# Patient Record
Sex: Male | Born: 1970 | Race: White | Hispanic: No | Marital: Single | Smoking: Never smoker
Health system: Southern US, Community
[De-identification: ages and names within clinical notes are randomized; demographics above are authoritative.]

## PROBLEM LIST (undated history)

## (undated) DIAGNOSIS — I4891 Unspecified atrial fibrillation: Secondary | ICD-10-CM

## (undated) DIAGNOSIS — I10 Essential (primary) hypertension: Secondary | ICD-10-CM

## (undated) HISTORY — DX: Essential (primary) hypertension: I10

## (undated) HISTORY — PX: NO PAST SURGERIES: SHX2092

---

## 2005-01-18 ENCOUNTER — Ambulatory Visit: Payer: Self-pay | Admitting: General Practice

## 2014-09-15 DIAGNOSIS — I4891 Unspecified atrial fibrillation: Secondary | ICD-10-CM

## 2014-09-15 HISTORY — DX: Unspecified atrial fibrillation: I48.91

## 2015-08-16 ENCOUNTER — Other Ambulatory Visit
Admission: RE | Admit: 2015-08-16 | Discharge: 2015-08-16 | Disposition: A | Discharge status: Home / Self Care | Payer: Worker's Compensation | Attending: Family Medicine | Admitting: Family Medicine

## 2016-01-11 ENCOUNTER — Ambulatory Visit: Payer: Self-pay | Admitting: Physician Assistant

## 2016-01-11 ENCOUNTER — Encounter: Payer: Self-pay | Admitting: Physician Assistant

## 2016-01-11 VITALS — BP 135/90 | HR 61 | Temp 97.8°F

## 2016-01-11 DIAGNOSIS — I10 Essential (primary) hypertension: Secondary | ICD-10-CM

## 2016-01-11 DIAGNOSIS — H9313 Tinnitus, bilateral: Secondary | ICD-10-CM

## 2016-01-11 MED ORDER — LISINOPRIL 20 MG PO TABS: 20.0000 mg | ORAL_TABLET | Freq: Every day | ORAL | Status: DC

## 2016-01-11 NOTE — Addendum Note (Signed)
Addended by: Rudene Anda T on: 01/11/2016 09:05 AM   Modules accepted: Orders

## 2016-01-11 NOTE — Progress Notes (Signed)
   Subjective: Ringing in Ears    Patient ID: Bradley Sexton, male    DOB: December 07, 1970, 45 y.o.   MRN: [id number]  HPI  Patient c/o 3-4 years of Ringing in both ears.Strong family history of this compliant. Patient has not been evaluated by ENT. Also request refill of Lisinopril.   Review of Systems Obesity. And HTN.    Objective:   Physical Exam HEENT unremarkable. Neck supple. Lungs CTA, and Heart RRR.       Assessment & Plan:Tinnitus  Consult to ENT and refill Lisinopril 20 mg.

## 2016-01-11 NOTE — Addendum Note (Signed)
Addended by: Sable Feil on: 01/11/2016 10:28 AM   Modules accepted: Orders

## 2016-01-12 LAB — CMP12+LP+TP+TSH+6AC+PSA+CBC…
A/G RATIO: 1.4 (ref 1.2–2.2)
ALBUMIN: 3.8 g/dL (ref 3.5–5.5)
ALT: 27 IU/L (ref 0–44)
AST: 22 IU/L (ref 0–40)
Alkaline Phosphatase: 76 IU/L (ref 39–117)
BILIRUBIN TOTAL: 0.7 mg/dL (ref 0.0–1.2)
BUN/Creatinine Ratio: 16 (ref 9–20)
BUN: 14 mg/dL (ref 6–24)
Basophils Absolute: 0 10*3/uL (ref 0.0–0.2)
Basos: 0 %
CHOLESTEROL TOTAL: 193 mg/dL (ref 100–199)
CREATININE: 0.88 mg/dL (ref 0.76–1.27)
Calcium: 9.5 mg/dL (ref 8.7–10.2)
Chloride: 101 mmol/L (ref 96–106)
Chol/HDL Ratio: 5.8 ratio units — ABNORMAL HIGH (ref 0.0–5.0)
EOS (ABSOLUTE): 0.1 10*3/uL (ref 0.0–0.4)
ESTIMATED CHD RISK: 1.2 times avg. — AB (ref 0.0–1.0)
Eos: 1 %
Free Thyroxine Index: 2.8 (ref 1.2–4.9)
GFR, EST AFRICAN AMERICAN: 122 mL/min/{1.73_m2} (ref >59)
GFR, EST NON AFRICAN AMERICAN: 105 mL/min/{1.73_m2} (ref >59)
GGT: 29 IU/L (ref 0–65)
GLOBULIN, TOTAL: 2.8 g/dL (ref 1.5–4.5)
Glucose: 87 mg/dL (ref 65–99)
HDL: 33 mg/dL — AB (ref >39)
Hematocrit: 43.4 % (ref 37.5–51.0)
Hemoglobin: 14.4 g/dL (ref 12.6–17.7)
IRON: 121 ug/dL (ref 38–169)
Immature Grans (Abs): 0 10*3/uL (ref 0.0–0.1)
Immature Granulocytes: 0 %
LDH: 160 IU/L (ref 121–224)
LDL Calculated: 140 mg/dL — ABNORMAL HIGH (ref 0–99)
Lymphocytes Absolute: 2.3 10*3/uL (ref 0.7–3.1)
Lymphs: 38 %
MCH: 30.7 pg (ref 26.6–33.0)
MCHC: 33.2 g/dL (ref 31.5–35.7)
MCV: 93 fL (ref 79–97)
MONOS ABS: 0.4 10*3/uL (ref 0.1–0.9)
Monocytes: 6 %
NEUTROS ABS: 3.2 10*3/uL (ref 1.4–7.0)
Neutrophils: 55 %
POTASSIUM: 5.1 mmol/L (ref 3.5–5.2)
Phosphorus: 3.1 mg/dL (ref 2.5–4.5)
Platelets: 223 10*3/uL (ref 150–379)
Prostate Specific Ag, Serum: 0.3 ng/mL (ref 0.0–4.0)
RBC: 4.69 x10E6/uL (ref 4.14–5.80)
RDW: 13 % (ref 12.3–15.4)
Sodium: 141 mmol/L (ref 134–144)
T3 UPTAKE RATIO: 30 % (ref 24–39)
T4, Total: 9.3 ug/dL (ref 4.5–12.0)
TOTAL PROTEIN: 6.6 g/dL (ref 6.0–8.5)
TRIGLYCERIDES: 101 mg/dL (ref 0–149)
TSH: 3.21 u[IU]/mL (ref 0.450–4.500)
Uric Acid: 9.5 mg/dL — ABNORMAL HIGH (ref 3.7–8.6)
VLDL CHOLESTEROL CAL: 20 mg/dL (ref 5–40)
WBC: 6 10*3/uL (ref 3.4–10.8)

## 2016-01-12 LAB — VITAMIN D 25 HYDROXY (VIT D DEFICIENCY, FRACTURES): VIT D 25 HYDROXY: 29.9 ng/mL — AB (ref 30.0–100.0)

## 2016-03-10 ENCOUNTER — Ambulatory Visit: Payer: Self-pay | Admitting: Physician Assistant

## 2016-03-10 ENCOUNTER — Encounter: Payer: Self-pay | Admitting: Physician Assistant

## 2016-03-10 VITALS — BP 130/90 | HR 72 | Temp 98.3°F

## 2016-03-10 DIAGNOSIS — J069 Acute upper respiratory infection, unspecified: Secondary | ICD-10-CM

## 2016-03-10 DIAGNOSIS — W57XXXA Bitten or stung by nonvenomous insect and other nonvenomous arthropods, initial encounter: Secondary | ICD-10-CM

## 2016-03-10 MED ORDER — DOXYCYCLINE HYCLATE 100 MG PO TABS: 100.0000 mg | ORAL_TABLET | Freq: Two times a day (BID) | ORAL | Status: DC

## 2016-03-10 MED ORDER — FLUTICASONE PROPIONATE 50 MCG/ACT NA SUSP: 2.0000 | Freq: Every day | NASAL | Status: DC

## 2016-03-10 NOTE — Progress Notes (Signed)
S: C/o runny nose and congestion, sore throat, cough for 3 days, + fever, chills, no cp/sob, v/d; mucus was green/brown , cough is sporadic, also had tick bite about 3 weeks ago, no rash  Using otc meds: coricidan  O: PE: vitals wnl, perrl eomi, normocephalic, tms dull, nasal mucosa red and swollen, throat injected, neck supple no lymph, lungs c t a, cv rrr, neuro intact  A:  Acute  Uri, tick bite   P: flonase, doxy, recheck bp when at home, if still elevated f/u with pcp, drink fluids, continue regular meds , use otc meds of choice, return if not improving in 5 days, return earlier if worsening

## 2017-01-21 ENCOUNTER — Ambulatory Visit: Payer: Self-pay | Admitting: Physician Assistant

## 2017-01-21 ENCOUNTER — Encounter: Payer: Self-pay | Admitting: Physician Assistant

## 2017-01-21 VITALS — BP 120/80 | HR 73 | Temp 97.8°F | Resp 16 | Ht 72.0 in | Wt 332.0 lb

## 2017-01-21 DIAGNOSIS — Z0000 Encounter for general adult medical examination without abnormal findings: Secondary | ICD-10-CM

## 2017-01-21 DIAGNOSIS — Z008 Encounter for other general examination: Secondary | ICD-10-CM

## 2017-01-21 DIAGNOSIS — Z0189 Encounter for other specified special examinations: Secondary | ICD-10-CM

## 2017-01-21 MED ORDER — LISINOPRIL 20 MG PO TABS: 20.0000 mg | ORAL_TABLET | Freq: Every day | ORAL | 3 refills | Status: AC

## 2017-01-21 NOTE — Progress Notes (Signed)
S: pt here for wellness physical and biometrics for insurance purposes, no complaints ros neg. States he has been eating better and going to the gym, has lost between 40-50lbs;  PMH: htn x 2years   Social: nonsmoker, no etoh, no drugs, works at [employer]: mother died at 21 of MI or stroke, had htn, dm, high cholesterol; father is still living in his 55s and healthy, 1 brother is prediabetic, 1 sister is healthy  O: vitals wnl, nad, ENT wnl, neck supple no lymph, lungs c t a, cv rrr, abd soft nontender bs normal all 4 quads  A: wellness, biometric physical  P: fasting labs today, return in April for repeat physical/biometrics, concentrate on weight loss and healthy eating habits, refill of bp meds sent to pharmacy

## 2017-01-22 LAB — CMP12+LP+TP+TSH+6AC+PSA+CBC…
ALK PHOS: 84 IU/L (ref 39–117)
ALT: 25 IU/L (ref 0–44)
AST: 32 IU/L (ref 0–40)
Albumin/Globulin Ratio: 1.7 (ref 1.2–2.2)
Albumin: 4.3 g/dL (ref 3.5–5.5)
BASOS ABS: 0 10*3/uL (ref 0.0–0.2)
BUN/Creatinine Ratio: 14 (ref 9–20)
BUN: 13 mg/dL (ref 6–24)
Basos: 0 %
Bilirubin Total: 0.7 mg/dL (ref 0.0–1.2)
CALCIUM: 9.1 mg/dL (ref 8.7–10.2)
CHOLESTEROL TOTAL: 155 mg/dL (ref 100–199)
Chloride: 103 mmol/L (ref 96–106)
Chol/HDL Ratio: 4.8 ratio (ref 0.0–5.0)
Creatinine, Ser: 0.93 mg/dL (ref 0.76–1.27)
EOS (ABSOLUTE): 0.1 10*3/uL (ref 0.0–0.4)
Eos: 1 %
Estimated CHD Risk: 1 times avg. (ref 0.0–1.0)
FREE THYROXINE INDEX: 2.3 (ref 1.2–4.9)
GFR calc Af Amer: 115 mL/min/{1.73_m2} (ref >59)
GFR calc non Af Amer: 99 mL/min/{1.73_m2} (ref >59)
GGT: 22 IU/L (ref 0–65)
Globulin, Total: 2.6 g/dL (ref 1.5–4.5)
Glucose: 89 mg/dL (ref 65–99)
HDL: 32 mg/dL — ABNORMAL LOW (ref >39)
HEMOGLOBIN: 14.6 g/dL (ref 13.0–17.7)
Hematocrit: 43.7 % (ref 37.5–51.0)
IMMATURE GRANULOCYTES: 0 %
Immature Grans (Abs): 0 10*3/uL (ref 0.0–0.1)
Iron: 71 ug/dL (ref 38–169)
LDH: 196 IU/L (ref 121–224)
LDL CALC: 112 mg/dL — AB (ref 0–99)
LYMPHS ABS: 2.1 10*3/uL (ref 0.7–3.1)
Lymphs: 32 %
MCH: 30.8 pg (ref 26.6–33.0)
MCHC: 33.4 g/dL (ref 31.5–35.7)
MCV: 92 fL (ref 79–97)
MONOS ABS: 0.5 10*3/uL (ref 0.1–0.9)
Monocytes: 7 %
NEUTROS PCT: 60 %
Neutrophils Absolute: 3.8 10*3/uL (ref 1.4–7.0)
PLATELETS: 220 10*3/uL (ref 150–379)
PROSTATE SPECIFIC AG, SERUM: 0.7 ng/mL (ref 0.0–4.0)
Phosphorus: 2.9 mg/dL (ref 2.5–4.5)
Potassium: 5 mmol/L (ref 3.5–5.2)
RBC: 4.74 x10E6/uL (ref 4.14–5.80)
RDW: 13 % (ref 12.3–15.4)
Sodium: 142 mmol/L (ref 134–144)
T3 Uptake Ratio: 29 % (ref 24–39)
T4 TOTAL: 8 ug/dL (ref 4.5–12.0)
TRIGLYCERIDES: 54 mg/dL (ref 0–149)
TSH: 3.91 u[IU]/mL (ref 0.450–4.500)
Total Protein: 6.9 g/dL (ref 6.0–8.5)
Uric Acid: 9.3 mg/dL — ABNORMAL HIGH (ref 3.7–8.6)
VLDL Cholesterol Cal: 11 mg/dL (ref 5–40)
WBC: 6.6 10*3/uL (ref 3.4–10.8)

## 2017-01-22 LAB — VITAMIN D 25 HYDROXY (VIT D DEFICIENCY, FRACTURES): Vit D, 25-Hydroxy: 42.7 ng/mL (ref 30.0–100.0)

## 2017-01-22 LAB — HCV COMMENT:

## 2017-01-22 LAB — HIV ANTIBODY (ROUTINE TESTING W REFLEX): HIV SCREEN 4TH GENERATION: NONREACTIVE

## 2017-01-22 LAB — HGB A1C W/O EAG: Hgb A1c MFr Bld: 5.4 % (ref 4.8–5.6)

## 2017-01-22 LAB — HEPATITIS C ANTIBODY (REFLEX)

## 2018-01-22 ENCOUNTER — Ambulatory Visit: Payer: Self-pay | Admitting: Family Medicine

## 2018-01-22 VITALS — BP 161/110 | HR 62 | Temp 97.9°F | Resp 14 | Ht 72.0 in | Wt 323.0 lb

## 2018-01-22 DIAGNOSIS — Z0189 Encounter for other specified special examinations: Principal | ICD-10-CM

## 2018-01-22 DIAGNOSIS — Z008 Encounter for other general examination: Secondary | ICD-10-CM

## 2018-01-22 LAB — GLUCOSE, POCT (MANUAL RESULT ENTRY): POC GLUCOSE: 96 mg/dL (ref 70–99)

## 2018-01-22 NOTE — Progress Notes (Signed)
Subjective: Annual biometrics screening  Patient presents for his annual biometric screening. Patient reports a history of hypertension.  Patient denies getting regular exercise or eating a healthy, well-rounded diet.  Patient reports increased stress in the last 2 months that has resulted in this change. PCP: None currently. Patient denies any other issues or concerns.   Review of Systems Unremarkable  Objective  Physical Exam General: Awake, alert and oriented. No acute distress. Well developed, hydrated and nourished. Appears stated age.  HEENT: Supple neck without adenopathy. Sclera is non-icteric. The ear canal is clear without discharge. The tympanic membrane is normal in appearance with normal landmarks and cone of light. Nasal mucosa is pink and moist. Oral mucosa is pink and moist. The pharynx is normal in appearance without tonsillar swelling or exudates.  Skin: Skin in warm, dry and intact without rashes or lesions. Appropriate color for ethnicity. Cardiac: Heart rate and rhythm are normal. No murmurs, gallops, or rubs are auscultated.  Respiratory: The chest wall is symmetric and without deformity. No signs of respiratory distress. Lung sounds are clear in all lobes bilaterally without rales, ronchi, or wheezes.  Neurological: The patient is awake, alert and oriented to person, place, and time with normal speech.  Memory is normal and thought processes intact. No gait abnormalities are appreciated.  Psychiatric: Appropriate mood and affect.   Assessment Annual biometrics screening  Plan  Lipid panel pending. Encouraged routine visits with primary care provider.  Provided patient with resources and encouraged him to establish care/see a primary care provider within the next month. Patient's blood pressure is elevated today.  Patient reports he did not take his blood pressure medicine this morning.  Advised patient to monitor his blood pressure daily and to keep a log of this.   Advised him to report abnormal values and follow-up with his primary care provider. Fasting blood sugar is 96 today. Encouraged patient to get regular exercise and eat a healthy, well-rounded diet.

## 2018-01-23 LAB — LIPID PANEL
CHOLESTEROL TOTAL: 155 mg/dL (ref 100–199)
Chol/HDL Ratio: 4.1 ratio (ref 0.0–5.0)
HDL: 38 mg/dL — ABNORMAL LOW (ref >39)
LDL Calculated: 104 mg/dL — ABNORMAL HIGH (ref 0–99)
Triglycerides: 63 mg/dL (ref 0–149)
VLDL Cholesterol Cal: 13 mg/dL (ref 5–40)

## 2018-01-25 NOTE — Progress Notes (Signed)
Dear Bradley Sexton, I wanted to let you know that your lipid panel came back.  Everything is normal with the exception of your HDL cholesterol and LDL cholesterol. Your HDL cholesterol ("good cholesterol") is slightly decreased at 38, normal values are above 39.  This is improved from last year, which was 29.  Your LDL cholesterol ("bad cholesterol") is slightly elevated at 104, normal values are below 99.  This is improved from last year, which was 112.  I want you to follow-up with your primary care provider regarding these results.

## 2018-02-17 DIAGNOSIS — I4891 Unspecified atrial fibrillation: Secondary | ICD-10-CM | POA: Diagnosis present

## 2018-05-30 ENCOUNTER — Other Ambulatory Visit: Payer: Self-pay

## 2018-05-30 ENCOUNTER — Emergency Department
Admission: EM | Admit: 2018-05-30 | Discharge: 2018-05-30 | Disposition: A | Discharge status: Home / Self Care | Payer: Managed Care, Other (non HMO) | Attending: Emergency Medicine | Admitting: Emergency Medicine

## 2018-05-30 ENCOUNTER — Encounter: Payer: Self-pay | Admitting: Emergency Medicine

## 2018-05-30 ENCOUNTER — Emergency Department: Payer: Managed Care, Other (non HMO)

## 2018-05-30 DIAGNOSIS — S6992XA Unspecified injury of left wrist, hand and finger(s), initial encounter: Secondary | ICD-10-CM | POA: Diagnosis present

## 2018-05-30 DIAGNOSIS — Y929 Unspecified place or not applicable: Secondary | ICD-10-CM | POA: Diagnosis not present

## 2018-05-30 DIAGNOSIS — Y999 Unspecified external cause status: Secondary | ICD-10-CM | POA: Insufficient documentation

## 2018-05-30 DIAGNOSIS — Y9301 Activity, walking, marching and hiking: Secondary | ICD-10-CM | POA: Diagnosis not present

## 2018-05-30 DIAGNOSIS — I10 Essential (primary) hypertension: Secondary | ICD-10-CM | POA: Diagnosis not present

## 2018-05-30 DIAGNOSIS — S52612A Displaced fracture of left ulna styloid process, initial encounter for closed fracture: Secondary | ICD-10-CM | POA: Diagnosis not present

## 2018-05-30 DIAGNOSIS — Z79899 Other long term (current) drug therapy: Secondary | ICD-10-CM | POA: Insufficient documentation

## 2018-05-30 DIAGNOSIS — S52592A Other fractures of lower end of left radius, initial encounter for closed fracture: Secondary | ICD-10-CM | POA: Diagnosis not present

## 2018-05-30 DIAGNOSIS — Z87891 Personal history of nicotine dependence: Secondary | ICD-10-CM | POA: Diagnosis not present

## 2018-05-30 DIAGNOSIS — W010XXA Fall on same level from slipping, tripping and stumbling without subsequent striking against object, initial encounter: Secondary | ICD-10-CM | POA: Insufficient documentation

## 2018-05-30 MED ORDER — ACETAMINOPHEN 325 MG PO TABS
650.0000 mg | ORAL_TABLET | Freq: Once | ORAL | Status: AC
Start: 2018-05-30 — End: 2018-05-30
  Administered 2018-05-30: 650 mg via ORAL
  Filled 2018-05-30: qty 2

## 2018-05-30 MED ORDER — MELOXICAM 7.5 MG PO TABS
15.0000 mg | ORAL_TABLET | Freq: Once | ORAL | Status: AC
  Administered 2018-05-30: 15 mg via ORAL
  Filled 2018-05-30: qty 2

## 2018-05-30 MED ORDER — MELOXICAM 15 MG PO TABS: 15.0000 mg | ORAL_TABLET | Freq: Every day | ORAL | 0 refills | Status: DC

## 2018-05-30 MED ORDER — OXYCODONE-ACETAMINOPHEN 5-325 MG PO TABS: 1.0000 | ORAL_TABLET | Freq: Four times a day (QID) | ORAL | 0 refills | Status: DC | PRN
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
## 2018-05-30 NOTE — ED Triage Notes (Signed)
Pt with left wrist injury at 1845 today. Cms intact to fingers, swelling and deformity noted. Ice applied.

## 2018-06-02 ENCOUNTER — Other Ambulatory Visit: Payer: Self-pay

## 2018-06-02 ENCOUNTER — Encounter
Admission: RE | Admit: 2018-06-02 | Discharge: 2018-06-02 | Disposition: A | Discharge status: Home / Self Care | Payer: Managed Care, Other (non HMO) | Source: Ambulatory Visit | Attending: Orthopedic Surgery | Admitting: Orthopedic Surgery

## 2018-06-02 HISTORY — DX: Unspecified atrial fibrillation: I48.91

## 2018-06-02 MED ORDER — DEXTROSE 5 % IV SOLN
3.0000 g | Freq: Once | INTRAVENOUS | Status: AC
  Administered 2018-06-03: 3 g via INTRAVENOUS
  Filled 2018-06-02: qty 3

## 2018-06-02 NOTE — Patient Instructions (Signed)
Your procedure is scheduled on: 06-03-18  Report to Same Day Surgery 2nd floor medical mall [facility] Entrance-take elevator on left to 2nd floor.  Check in with surgery information desk.) @ 9 AM   Remember: Instructions that are not followed completely may result in serious medical risk, up to and including death, or upon the discretion of your surgeon and anesthesiologist your surgery may need to be rescheduled.    _x___ 1. Do not eat food after midnight the night before your procedure. You may drink clear liquids up to 2 hours before you are scheduled to arrive at the hospital for your procedure.  Do not drink clear liquids within 2 hours of your scheduled arrival to the hospital.  Clear liquids include  --Water or Apple juice without pulp  --Clear carbohydrate beverage such as ClearFast or Gatorade  --Black Coffee or Clear Tea (No milk, no creamers, do not add anything to the coffee or Tea   ____Ensure clear carbohydrate drink on the way to the hospital for bariatric patients  ____Ensure clear carbohydrate drink 3 hours before surgery for Dr Dwyane Luo patients if physician instructed.   No gum chewing or hard candies.     __x__ 2. No Alcohol for 24 hours before or after surgery.   __x__3. No Smoking or e-cigarettes for 24 prior to surgery.  Do not use any chewable tobacco products for at least 6 hour prior to surgery   ____  4. Bring all medications with you on the day of surgery if instructed.    __x__ 5. Notify your doctor if there is any change in your medical condition     (cold, fever, infections).    x___6. On the morning of surgery brush your teeth with toothpaste and water.  You may rinse your mouth with mouth wash if you wish.  Do not swallow any toothpaste or mouthwash.   Do not wear jewelry, make-up, hairpins, clips or nail polish.  Do not wear lotions, powders, or perfumes. You may wear deodorant.  Do not shave 48 hours prior to surgery. Men may shave face and  neck.  Do not bring valuables to the hospital.    [facility] is not responsible for any belongings or valuables.               Contacts, dentures or bridgework may not be worn into surgery.  Leave your suitcase in the car. After surgery it may be brought to your room.  For patients admitted to the hospital, discharge time is determined by your treatment team.  _  Patients discharged the day of surgery will not be allowed to drive home.  You will need someone to drive you home and stay with you the night of your procedure.    Please read over the following fact sheets that you were given:   [facility] Preparing for Surgery  ____ Take anti-hypertensive listed below, cardiac, seizure, asthma, anti-reflux and psychiatric medicines. These include:  1. NONE  2.  3.  4.  5.  6.  ____Fleets enema or Magnesium Citrate as directed.   ____ Use CHG Soap or sage wipes as directed on instruction sheet   ____ Use inhalers on the day of surgery and bring to hospital day of surgery  ____ Stop Metformin and Janumet 2 days prior to surgery.    ____ Take 1/2 of usual insulin dose the night before surgery and none on the morning surgery.   ____ Follow recommendations from Cardiologist, Pulmonologist or  PCP regarding stopping Aspirin, Coumadin, Plavix ,Eliquis, Effient, or Pradaxa, and Pletal.  X____Stop Anti-inflammatories such as Advil, Aleve, Ibuprofen, Motrin, Naproxen,MOBIC, Naprosyn, Goodies powders or aspirin products NOW-OK to take Tylenol    _x___ Stop supplements until after surgery-LAST DOSE OF FLAXSEED OIL WAS TODAY (06-02-18)    ____ Bring C-Pap to the hospital.

## 2018-06-03 ENCOUNTER — Ambulatory Visit: Payer: Managed Care, Other (non HMO)

## 2018-06-03 ENCOUNTER — Encounter: Payer: Self-pay | Admitting: *Deleted

## 2018-06-03 ENCOUNTER — Ambulatory Visit
Admission: RE | Admit: 2018-06-03 | Discharge: 2018-06-03 | Disposition: A | Discharge status: Home / Self Care | Payer: Managed Care, Other (non HMO) | Source: Ambulatory Visit | Attending: Orthopedic Surgery | Admitting: Orthopedic Surgery

## 2018-06-03 ENCOUNTER — Ambulatory Visit: Payer: Managed Care, Other (non HMO) | Admitting: Anesthesiology

## 2018-06-03 ENCOUNTER — Encounter
Admission: RE | Disposition: A | Discharge status: Home / Self Care | Payer: Self-pay | Source: Ambulatory Visit | Attending: Orthopedic Surgery

## 2018-06-03 DIAGNOSIS — Z833 Family history of diabetes mellitus: Secondary | ICD-10-CM | POA: Diagnosis not present

## 2018-06-03 DIAGNOSIS — Z8249 Family history of ischemic heart disease and other diseases of the circulatory system: Secondary | ICD-10-CM | POA: Insufficient documentation

## 2018-06-03 DIAGNOSIS — I10 Essential (primary) hypertension: Secondary | ICD-10-CM | POA: Insufficient documentation

## 2018-06-03 DIAGNOSIS — Z79899 Other long term (current) drug therapy: Secondary | ICD-10-CM | POA: Insufficient documentation

## 2018-06-03 DIAGNOSIS — W010XXA Fall on same level from slipping, tripping and stumbling without subsequent striking against object, initial encounter: Secondary | ICD-10-CM | POA: Diagnosis not present

## 2018-06-03 DIAGNOSIS — S52592A Other fractures of lower end of left radius, initial encounter for closed fracture: Secondary | ICD-10-CM | POA: Insufficient documentation

## 2018-06-03 DIAGNOSIS — S52612A Displaced fracture of left ulna styloid process, initial encounter for closed fracture: Secondary | ICD-10-CM | POA: Insufficient documentation

## 2018-06-03 DIAGNOSIS — Y9389 Activity, other specified: Secondary | ICD-10-CM | POA: Diagnosis not present

## 2018-06-03 DIAGNOSIS — Z8781 Personal history of (healed) traumatic fracture: Secondary | ICD-10-CM

## 2018-06-03 DIAGNOSIS — Z9889 Other specified postprocedural states: Secondary | ICD-10-CM

## 2018-06-03 HISTORY — PX: OPEN REDUCTION INTERNAL FIXATION (ORIF) DISTAL RADIAL FRACTURE: SHX5989

## 2018-06-03 LAB — BASIC METABOLIC PANEL
Anion gap: 5 (ref 5–15)
BUN: 18 mg/dL (ref 6–20)
CO2: 27 mmol/L (ref 22–32)
Calcium: 8.9 mg/dL (ref 8.9–10.3)
Chloride: 107 mmol/L (ref 98–111)
Creatinine, Ser: 0.78 mg/dL (ref 0.61–1.24)
GFR calc Af Amer: >60 mL/min (ref >60)
GFR calc non Af Amer: >60 mL/min (ref >60)
GLUCOSE: 90 mg/dL (ref 70–99)
POTASSIUM: 4.1 mmol/L (ref 3.5–5.1)
Sodium: 139 mmol/L (ref 135–145)

## 2018-06-03 SURGERY — OPEN REDUCTION INTERNAL FIXATION (ORIF) DISTAL RADIUS FRACTURE
Anesthesia: General | Site: Wrist | Laterality: Left

## 2018-06-03 MED ORDER — EPHEDRINE SULFATE 50 MG/ML IJ SOLN
INTRAMUSCULAR | Status: DC | PRN
  Administered 2018-06-03: 10 mg via INTRAVENOUS

## 2018-06-03 MED ORDER — ONDANSETRON HCL 4 MG/2ML IJ SOLN: 4.0000 mg | Freq: Once | INTRAMUSCULAR | Status: DC | PRN

## 2018-06-03 MED ORDER — ONDANSETRON HCL 4 MG/2ML IJ SOLN
INTRAMUSCULAR | Status: AC
  Filled 2018-06-03: qty 2

## 2018-06-03 MED ORDER — FENTANYL CITRATE (PF) 100 MCG/2ML IJ SOLN
INTRAMUSCULAR | Status: AC
  Filled 2018-06-03: qty 2

## 2018-06-03 MED ORDER — DEXAMETHASONE SODIUM PHOSPHATE 10 MG/ML IJ SOLN
INTRAMUSCULAR | Status: DC | PRN
  Administered 2018-06-03: 10 mg via INTRAVENOUS

## 2018-06-03 MED ORDER — ACETAMINOPHEN 10 MG/ML IV SOLN
INTRAVENOUS | Status: DC | PRN
  Administered 2018-06-03: 1000 mg via INTRAVENOUS

## 2018-06-03 MED ORDER — SODIUM CHLORIDE 0.9 % IJ SOLN
INTRAMUSCULAR | Status: AC
  Filled 2018-06-03: qty 20

## 2018-06-03 MED ORDER — GLYCOPYRROLATE 0.2 MG/ML IJ SOLN
INTRAMUSCULAR | Status: DC | PRN
  Administered 2018-06-03 (×2): 0.2 mg via INTRAVENOUS

## 2018-06-03 MED ORDER — EPHEDRINE SULFATE 50 MG/ML IJ SOLN
INTRAMUSCULAR | Status: AC
  Filled 2018-06-03: qty 2

## 2018-06-03 MED ORDER — FENTANYL CITRATE (PF) 100 MCG/2ML IJ SOLN
INTRAMUSCULAR | Status: DC | PRN
  Administered 2018-06-03 (×4): 50 ug via INTRAVENOUS

## 2018-06-03 MED ORDER — HYDROCODONE-ACETAMINOPHEN 5-325 MG PO TABS: 1.0000 | ORAL_TABLET | Freq: Four times a day (QID) | ORAL | 0 refills | Status: DC | PRN

## 2018-06-03 MED ORDER — LIDOCAINE HCL (CARDIAC) PF 100 MG/5ML IV SOSY
PREFILLED_SYRINGE | INTRAVENOUS | Status: DC | PRN
  Administered 2018-06-03: 100 mg via INTRAVENOUS

## 2018-06-03 MED ORDER — ACETAMINOPHEN 10 MG/ML IV SOLN
INTRAVENOUS | Status: AC
  Filled 2018-06-03: qty 200

## 2018-06-03 MED ORDER — ONDANSETRON HCL 4 MG/2ML IJ SOLN
INTRAMUSCULAR | Status: DC | PRN
  Administered 2018-06-03: 4 mg via INTRAVENOUS

## 2018-06-03 MED ORDER — NEOMYCIN-POLYMYXIN B GU 40-200000 IR SOLN
Status: DC | PRN
  Administered 2018-06-03: 2 mL

## 2018-06-03 MED ORDER — DEXMEDETOMIDINE HCL 200 MCG/2ML IV SOLN
INTRAVENOUS | Status: DC | PRN
  Administered 2018-06-03: 12 ug via INTRAVENOUS
  Administered 2018-06-03: 8 ug via INTRAVENOUS

## 2018-06-03 MED ORDER — FAMOTIDINE 20 MG PO TABS
ORAL_TABLET | ORAL | Status: AC
  Administered 2018-06-03: 20 mg via ORAL
  Filled 2018-06-03: qty 1

## 2018-06-03 MED ORDER — LACTATED RINGERS IV SOLN
INTRAVENOUS | Status: DC
  Administered 2018-06-03: 09:00:00 via INTRAVENOUS

## 2018-06-03 MED ORDER — MIDAZOLAM HCL 2 MG/2ML IJ SOLN
INTRAMUSCULAR | Status: DC | PRN
  Administered 2018-06-03: 2 mg via INTRAVENOUS

## 2018-06-03 MED ORDER — FAMOTIDINE 20 MG PO TABS
20.0000 mg | ORAL_TABLET | Freq: Once | ORAL | Status: AC
  Administered 2018-06-03: 20 mg via ORAL

## 2018-06-03 MED ORDER — DEXMEDETOMIDINE HCL IN NACL 80 MCG/20ML IV SOLN
INTRAVENOUS | Status: AC
  Filled 2018-06-03: qty 20

## 2018-06-03 MED ORDER — PROPOFOL 10 MG/ML IV BOLUS
INTRAVENOUS | Status: DC | PRN
  Administered 2018-06-03: 200 mg via INTRAVENOUS

## 2018-06-03 MED ORDER — FENTANYL CITRATE (PF) 100 MCG/2ML IJ SOLN
25.0000 ug | INTRAMUSCULAR | Status: DC | PRN
  Administered 2018-06-03 (×5): 25 ug via INTRAVENOUS

## 2018-06-03 MED ORDER — MIDAZOLAM HCL 2 MG/2ML IJ SOLN
INTRAMUSCULAR | Status: AC
  Filled 2018-06-03: qty 2

## 2018-06-03 SURGICAL SUPPLY — 42 items
BANDAGE ACE 4X5 VEL STRL LF (GAUZE/BANDAGES/DRESSINGS) ×3 IMPLANT
BIT DRILL 2 FAST STEP (BIT) ×2 IMPLANT
BIT DRILL 2.5X4 QC (BIT) ×2 IMPLANT
CANISTER SUCT 1200ML W/VALVE (MISCELLANEOUS) ×3 IMPLANT
CHLORAPREP W/TINT 26ML (MISCELLANEOUS) ×3 IMPLANT
CUFF TOURN 18 STER (MISCELLANEOUS) ×2 IMPLANT
DRAPE FLUOR MINI C-ARM 54X84 (DRAPES) ×3 IMPLANT
ELECT REM PT RETURN 9FT ADLT (ELECTROSURGICAL) ×3
ELECTRODE REM PT RTRN 9FT ADLT (ELECTROSURGICAL) ×1 IMPLANT
GAUZE PETRO XEROFOAM 1X8 (MISCELLANEOUS) ×6 IMPLANT
GAUZE SPONGE 4X4 12PLY STRL (GAUZE/BANDAGES/DRESSINGS) ×3 IMPLANT
GLOVE SURG SYN 9.0  PF PI (GLOVE) ×2
GLOVE SURG SYN 9.0 PF PI (GLOVE) ×1 IMPLANT
GOWN SRG 2XL LVL 4 RGLN SLV (GOWNS) ×1 IMPLANT
GOWN STRL NON-REIN 2XL LVL4 (GOWNS) ×2
GOWN STRL REUS W/ TWL LRG LVL3 (GOWN DISPOSABLE) ×1 IMPLANT
GOWN STRL REUS W/TWL LRG LVL3 (GOWN DISPOSABLE) ×2
K-WIRE 1.6 (WIRE) ×4
K-WIRE FX5X1.6XNS BN SS (WIRE) ×2
KIT TURNOVER KIT A (KITS) ×3 IMPLANT
KWIRE FX5X1.6XNS BN SS (WIRE) IMPLANT
NDL FILTER BLUNT 18X1 1/2 (NEEDLE) ×1 IMPLANT
NEEDLE FILTER BLUNT 18X 1/2SAF (NEEDLE) ×2
NEEDLE FILTER BLUNT 18X1 1/2 (NEEDLE) ×1 IMPLANT
NS IRRIG 500ML POUR BTL (IV SOLUTION) ×3 IMPLANT
PACK EXTREMITY ARMC (MISCELLANEOUS) ×3 IMPLANT
PAD CAST CTTN 4X4 STRL (SOFTGOODS) ×2 IMPLANT
PADDING CAST COTTON 4X4 STRL (SOFTGOODS) ×4
PEG SUBCHONDRAL SMOOTH 2.0X18 (Peg) ×2 IMPLANT
PEG SUBCHONDRAL SMOOTH 2.0X20 (Peg) ×4 IMPLANT
PEG SUBCHONDRAL SMOOTH 2.0X24 (Peg) ×8 IMPLANT
PEG SUBCHONDRAL SMOOTH 2.0X28 (Peg) IMPLANT
PLATE SHORT 24.4X51.3 LT (Plate) ×2 IMPLANT
SCALPEL PROTECTED #15 DISP (BLADE) ×6 IMPLANT
SCREW BN 12X3.5XNS CORT TI (Screw) IMPLANT
SCREW CORT 3.5X12 (Screw) ×6 IMPLANT
SPLINT CAST 1 STEP 3X12 (MISCELLANEOUS) ×3 IMPLANT
SUT ETHILON 4-0 (SUTURE) ×2
SUT ETHILON 4-0 FS2 18XMFL BLK (SUTURE) ×1
SUT VICRYL 3-0 27IN (SUTURE) ×3 IMPLANT
SUTURE ETHLN 4-0 FS2 18XMF BLK (SUTURE) ×1 IMPLANT
SYR 3ML LL SCALE MARK (SYRINGE) ×3 IMPLANT

## 2018-06-03 NOTE — Op Note (Signed)
06/03/2018  11:28 AM  PATIENT:  Bradley Sexton  47 y.o. male  PRE-OPERATIVE DIAGNOSIS:  other closed fracture of distal end of left radius  POST-OPERATIVE DIAGNOSIS:  other closed fracture of distal end of left radius  PROCEDURE:  Procedure(s): OPEN REDUCTION INTERNAL FIXATION (ORIF) DISTAL RADIAL FRACTURE (Left)  SURGEON: Laurene Footman, MD  ASSISTANTS: none  ANESTHESIA:   general  EBL:  No intake/output data recorded.  BLOOD ADMINISTERED:none  DRAINS: none   LOCAL MEDICATIONS USED:  NONE  SPECIMEN:  No Specimen  DISPOSITION OF SPECIMEN:  N/A  COUNTS:  YES  TOURNIQUET:  38 minutes at 250 mmHg  IMPLANTS: [employer] hand innovations left short standard plate with multiple smooth pegs and screws  DICTATION: .Dragon Dictation patient was brought to the operating room and after adequate general anesthesia was obtained, the left arm was prepped and draped in the usual sterile fashion.  After patient identification and timeout procedure were completed, tourniquet was raised.  Approach was made over the FCR tendon with the tendon sheath incised and the tendon retracted radially to protect the radial artery and associated veins.  The deep fascia was incised and the muscles retracted to allow for exposure of the pronator which was then elevated off the distal and proximal fragments.  Traction was applied through the index and middle finger to help restore length.  The radial styloid fracture was mobilized and reduced and held with a pin in position.  A distal first approach was then utilized filling the distal pegs making sure they were staying out of the joint and but close to the joint.  With near anatomic reduction of the joint surface of the plate plate was brought down to the shaft to restore volar tilt.  Length volar tilt and radial inclination appears near-anatomic.  3 cortical screws were placed in the shaft without penetrating the dorsal cortex.  The traction was released none range of  motion the fracture appeared stable.  The wound was irrigated and tourniquet let down.  The wound was closed with 3-0 Vicryl subcutaneously and 4-0 nylon for the skin followed by Xeroform 4 x 4's web roll and a volar splint followed by an Ace wrap  PLAN OF CARE: Discharge to home after PACU  PATIENT DISPOSITION:  PACU - hemodynamically stable.

## 2018-06-03 NOTE — Anesthesia Post-op Follow-up Note (Signed)
Anesthesia QCDR form completed.        

## 2018-06-03 NOTE — H&P (Signed)
Reviewed paper H+P, will be scanned into chart. No changes noted.  

## 2018-06-03 NOTE — Anesthesia Preprocedure Evaluation (Signed)
Anesthesia Evaluation  Patient identified by MRN, date of birth, ID band Patient awake    Reviewed: Allergy & Precautions, H&P , NPO status , Patient's Chart, lab work & pertinent test results, reviewed documented beta blocker date and time   Airway Mallampati: II  TM Distance: >3 FB Neck ROM: full    Dental  (+) Teeth Intact   Pulmonary neg pulmonary ROS, neg sleep apnea,    Pulmonary exam normal        Cardiovascular Exercise Tolerance: Good hypertension, On Medications negative cardio ROS Normal cardiovascular examAtrial Fibrillation  Rate:Normal     Neuro/Psych negative neurological ROS  negative psych ROS   GI/Hepatic negative GI ROS, Neg liver ROS,   Endo/Other  negative endocrine ROS  Renal/GU negative Renal ROS  negative genitourinary   Musculoskeletal   Abdominal   Peds  Hematology negative hematology ROS (+)   Anesthesia Other Findings   Reproductive/Obstetrics negative OB ROS                             Anesthesia Physical Anesthesia Plan  ASA: III  Anesthesia Plan: General LMA   Post-op Pain Management:    Induction:   PONV Risk Score and Plan:   Airway Management Planned:   Additional Equipment:   Intra-op Plan:   Post-operative Plan:   Informed Consent: I have reviewed the patients History and Physical, chart, labs and discussed the procedure including the risks, benefits and alternatives for the proposed anesthesia with the patient or authorized representative who has indicated his/her understanding and acceptance.     Plan Discussed with: CRNA  Anesthesia Plan Comments:         Anesthesia Quick Evaluation

## 2018-06-03 NOTE — Transfer of Care (Signed)
Immediate Anesthesia Transfer of Care Note  Patient: Bradley Sexton  Procedure(s) Performed: OPEN REDUCTION INTERNAL FIXATION (ORIF) DISTAL RADIAL FRACTURE (Left Wrist)  Patient Location: PACU  Anesthesia Type:General  Level of Consciousness: awake and alert   Airway & Oxygen Therapy: Patient Spontanous Breathing and Patient connected to face mask oxygen  Post-op Assessment: Report given to RN and Post -op Vital signs reviewed and stable  Post vital signs: Reviewed  Last Vitals:  Vitals Value Taken Time  BP 88/53 06/03/2018 11:34 AM  Temp    Pulse 94 06/03/2018 11:34 AM  Resp 20 06/03/2018 11:34 AM  SpO2 99 % 06/03/2018 11:34 AM    Last Pain:  Vitals:   06/03/18 0851  TempSrc: Oral  PainSc: 3          Complications: No apparent anesthesia complications

## 2018-06-03 NOTE — Anesthesia Postprocedure Evaluation (Signed)
Anesthesia Post Note  Patient: Bradley Sexton  Procedure(s) Performed: OPEN REDUCTION INTERNAL FIXATION (ORIF) DISTAL RADIAL FRACTURE (Left Wrist)  Patient location during evaluation: PACU Anesthesia Type: General Level of consciousness: awake and alert Pain management: pain level controlled Vital Signs Assessment: post-procedure vital signs reviewed and stable Respiratory status: spontaneous breathing, nonlabored ventilation, respiratory function stable and patient connected to nasal cannula oxygen Cardiovascular status: blood pressure returned to baseline and stable Postop Assessment: no apparent nausea or vomiting Anesthetic complications: no     Last Vitals:  Vitals:   06/03/18 1234 06/03/18 1312  BP: 127/85 134/79  Pulse: 70 64  Resp: 16 16  Temp: 36.6 C   SpO2: 95% 98%    Last Pain:  Vitals:   06/03/18 1312  TempSrc:   PainSc: 2                  Molli Barrows

## 2018-06-03 NOTE — Discharge Instructions (Addendum)
Arm elevated as much as possible.  Pain medicine as directed.  Work on finger range of motion is much as he can tolerate.  Keep Splint clean and dry

## 2018-06-03 NOTE — Anesthesia Procedure Notes (Signed)
Procedure Name: LMA Insertion Date/Time: 06/03/2018 10:30 AM Performed by: Rolla Plate, CRNA Pre-anesthesia Checklist: Patient identified, Patient being monitored, Timeout performed, Emergency Drugs available and Suction available Patient Re-evaluated:Patient Re-evaluated prior to induction Oxygen Delivery Method: Circle system utilized Preoxygenation: Pre-oxygenation with 100% oxygen Induction Type: IV induction Ventilation: Mask ventilation without difficulty LMA: LMA inserted LMA Size: 5.0 Tube type: Oral Number of attempts: 1 Placement Confirmation: positive ETCO2 and breath sounds checked- equal and bilateral Tube secured with: Tape Dental Injury: Teeth and Oropharynx as per pre-operative assessment

## 2018-06-04 ENCOUNTER — Encounter: Payer: Self-pay | Admitting: Orthopedic Surgery

## 2018-06-14 ENCOUNTER — Ambulatory Visit: Payer: Managed Care, Other (non HMO) | Attending: Orthopedic Surgery | Admitting: Occupational Therapy

## 2018-06-14 ENCOUNTER — Other Ambulatory Visit: Payer: Self-pay

## 2018-06-14 DIAGNOSIS — M25532 Pain in left wrist: Secondary | ICD-10-CM | POA: Insufficient documentation

## 2018-06-14 DIAGNOSIS — M6281 Muscle weakness (generalized): Secondary | ICD-10-CM | POA: Diagnosis present

## 2018-06-14 DIAGNOSIS — M25632 Stiffness of left wrist, not elsewhere classified: Secondary | ICD-10-CM | POA: Insufficient documentation

## 2018-06-14 NOTE — Therapy (Signed)
[place] [facility] [address], [place], [address] Phone: [phone]   Fax:  [phone]  Occupational Therapy Evaluation  Patient Details  Name: Bradley Sexton MRN: [id number] Date of Birth: [birth year] No data recorded  Encounter Date: 06/14/2018  OT End of Session - 06/14/18 1949    Visit Number  1    Number of Visits  12    Date for OT Re-Evaluation  08/09/18    OT Start Time  0808    OT Stop Time  0835    OT Time Calculation (min)  27 min    Activity Tolerance  Patient tolerated treatment well    Behavior During Therapy  [facility] for tasks assessed/performed       Past Medical History:  Diagnosis Date  . Atrial fibrillation ([place]) 2016   X 1 WHEN GASSED AT [employer]  . Hypertension     Past Surgical History:  Procedure Laterality Date  . NO PAST SURGERIES    . OPEN REDUCTION INTERNAL FIXATION (ORIF) DISTAL RADIAL FRACTURE Left 06/03/2018   Procedure: OPEN REDUCTION INTERNAL FIXATION (ORIF) DISTAL RADIAL FRACTURE;  Surgeon: Hessie Knows, MD;  Location: [facility] ORS;  Service: Orthopedics;  Laterality: Left;    There were no vitals filed for this visit.  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  Yes    Pain Score  3     Pain Location  Wrist    Pain Orientation  Left    Pain Descriptors / Indicators  Aching    Pain Type  Surgical pain    Pain Onset  1 to 4 weeks ago    Pain Frequency  Constant        [facility] OT Assessment - 06/14/18 0001      Assessment   Medical Diagnosis  L ORIF distal radius fx/ulna    Onset Date/Surgical Date  06/03/18    Hand  Dominance  Right    Next MD Visit  --   4th Oct     Precautions   Precaution Comments  Wrist Fx ORIF protocol      Balance Screen   Has the patient fallen in the past 6 months  Yes    How many times?  1    Has the patient had a decrease in activity level because of a fear of falling?   No    Is the patient reluctant to leave their home because of a fear of falling?   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, dance , gym       AROM   Right Forearm Supination  90 Degrees    Left Forearm Supination  15 Degrees    Right Wrist Extension  70 Degrees  Right Wrist Flexion  88 Degrees    Right Wrist Radial Deviation  20 Degrees    Right Wrist Ulnar Deviation  35 Degrees    Left Wrist Extension  28 Degrees    Left Wrist Flexion  30 Degrees    Left Wrist Radial Deviation  20 Degrees    Left Wrist Ulnar Deviation  15 Degrees           Review HEP - contacted Dr Rudene Christians to confirm protocol   phone pt to reintegrate protocol:    Contrast- and tubigrip on for compression under splint   Tendon glides10 reps  Opposition  1oo eps  Sup /pro with wrist splint on - pain free  Pain should be less than 2/10 - slight pull or stretch            OT Education - 06/14/18 1949    Education Details  findings , protocol for wrist fx ORIF , HEP     Person(s) Educated  Patient    Methods  Explanation;Demonstration;Handout    Comprehension  Verbalized understanding;Returned demonstration       OT Short Term Goals - 06/14/18 1957      OT SHORT TERM GOAL #1   Title  Pt to be ind in HEP for scar management, ROM and strength to increase use of L hand and wean out of splint     Baseline  no knowledge    Time  3    Period  Weeks    Status  New    Target Date  07/05/18        OT Long Term Goals - 06/14/18 1958      OT LONG TERM GOAL #1   Title  L wrist AROM improve to [facility] to be able to use in  more than 50% of ADL's  with no increase symptoms     Baseline  pain at rest 3/10 and wrist AROM impaired in all planes -     Time  4    Period  Weeks    Status  New    Target Date  07/12/18      OT LONG TERM GOAL #2   Title  Grip strength in L hand increase to more than 50% compare to R to be able to carry more than 5 lbs and cut with knife     Baseline  NT - pt only 11 days s/p    Time  6    Period  Weeks    Status  New    Target Date  07/26/18      OT LONG TERM GOAL #3   Title  Wrist strength increase to 4+/5 to turn doorknob, push door , drive with L hand with no increase symptoms     Baseline  NT - only 11 days s/p    Time  8    Period  Weeks    Status  New    Target Date  08/09/18            Plan - 06/14/18 1952    Clinical Impression Statement  Pt present at OT eval s/p ORIF L distal radius fx with ulna fx - pt only seen for digits AROM - and pt now WNL - still have edema and pain - pt to do contrast- and compression - and do pain free ROM for digits only - and in splint can do sup /pro - but pain need to be less than 2/10 pain -  pt to get sutures out on Friday and return next week for scar managament and then will start AROM for wrist at 4 wks s/p per protocol     Occupational performance deficits (Please refer to evaluation for details):  ADL's;IADL's;Work;Play;Leisure    Rehab Potential  Good    OT Frequency  --   1-2 x wk   OT Duration  8 weeks    OT Treatment/Interventions  Therapeutic exercise;Self-care/ADL training;Contrast Bath;Fluidtherapy;Patient/family education;Scar mobilization;Passive range of motion;Manual Therapy    Plan  Assess scar and ed on scar management     Clinical Decision Making  Several treatment options, min-mod task modification necessary    OT Home Exercise Plan  see flowsheet    Consulted and Agree with Plan of Care  Patient       Patient will benefit from skilled therapeutic intervention in order to improve the following deficits and  impairments:  Pain, Impaired flexibility, Increased edema, Decreased scar mobility, Decreased range of motion, Decreased strength, Impaired UE functional use  Visit Diagnosis: Pain in left wrist - Plan: Ot plan of care cert/re-cert  Stiffness of left wrist, not elsewhere classified - Plan: Ot plan of care cert/re-cert  Muscle weakness (generalized) - Plan: Ot plan of care cert/re-cert    Problem List There are no active problems to display for this patient.   Rosalyn Gess OTR/L,CLT 06/14/2018, 8:10 PM  [place] [facility] [address] [place], [address] Phone: [phone]   Fax:  [phone]  Name: Bradley Sexton MRN: [id number] Date of Birth: 1971-08-01

## 2018-06-14 NOTE — Patient Instructions (Signed)
Contrast- and tubigrip on for compression under splint   Tendon glides10 reps  Opposition  1oo eps  Sup /pro with wrist splint on - pain free  Pain should be less than 2/10 - slight pull or stretch

## 2018-06-21 ENCOUNTER — Ambulatory Visit: Payer: Managed Care, Other (non HMO) | Attending: Orthopedic Surgery | Admitting: Occupational Therapy

## 2018-06-21 DIAGNOSIS — M25632 Stiffness of left wrist, not elsewhere classified: Secondary | ICD-10-CM | POA: Insufficient documentation

## 2018-06-21 DIAGNOSIS — M6281 Muscle weakness (generalized): Secondary | ICD-10-CM | POA: Insufficient documentation

## 2018-06-21 DIAGNOSIS — L905 Scar conditions and fibrosis of skin: Secondary | ICD-10-CM | POA: Insufficient documentation

## 2018-06-21 DIAGNOSIS — M25532 Pain in left wrist: Secondary | ICD-10-CM | POA: Diagnosis not present

## 2018-06-21 NOTE — Patient Instructions (Signed)
Gentle scar massage about Friday when last sterri strip fell off  cica scar pad for night time   AROM for thumb PA and RA  And Opposition - with sliding down 5th - stop when feeling light pull  And Sup /pro in splint - but stop when feeling pull  Less than 2/10  At about 4 wks can start gentle pain free AROM flexion , ext, RD, UD  10 reps  After some heat

## 2018-06-21 NOTE — Therapy (Signed)
[place] [facility] [address] [place], [address] Phone: [phone]   Fax:  [phone]  Occupational Therapy Treatment  Patient Details  Name: DNAIEL VOLLER MRN: [id number] Date of Birth: 1970/11/12 No data recorded  Encounter Date: 06/21/2018  OT End of Session - 06/21/18 0852    Visit Number  2    Number of Visits  12    Date for OT Re-Evaluation  08/09/18    OT Start Time  0806    OT Stop Time  0828    OT Time Calculation (min)  22 min    Activity Tolerance  Patient tolerated treatment well    Behavior During Therapy  [facility] for tasks assessed/performed       Past Medical History:  Diagnosis Date  . Atrial fibrillation ([place]) 2016   X 1 WHEN GASSED AT [employer]  . Hypertension     Past Surgical History:  Procedure Laterality Date  . NO PAST SURGERIES    . OPEN REDUCTION INTERNAL FIXATION (ORIF) DISTAL RADIAL FRACTURE Left 06/03/2018   Procedure: OPEN REDUCTION INTERNAL FIXATION (ORIF) DISTAL RADIAL FRACTURE;  Surgeon: Hessie Knows, MD;  Location: [facility] ORS;  Service: Orthopedics;  Laterality: Left;    There were no vitals filed for this visit.  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  Yes    Pain Score  1     Pain Location  Wrist    Pain Orientation  Left    Pain Descriptors / Indicators  Aching    Pain Type  Surgical pain    Pain Onset  1 to 4 weeks ago    Pain Frequency  Constant        Pt arrive with stiches out since Friday - had 3 sterri strips in tact - removed distal and proximal 2 - replace the one in middle  pt ed on scar massage  And then cica scar pad use - but can start that  about Thursday - when 3 wks s//p   Can cont with sup /pro in splint with pull less than 2/10 - pt report the pain he felt at ulnar styloid is better  pt can do thumb PA , RA and opposition to base of 5th  In meantime   And at 4 wks s/p can start AROM gentle wrist AROM  In all planes  10 reps  pull less than 2/10                     OT Education - 06/21/18 0852    Education Details  scar massage and cica scar pad wearing - and AROM gentle at 4 wks s/p    Person(s) Educated  Patient    Methods  Explanation;Demonstration;Handout    Comprehension  Verbalized understanding;Returned demonstration       OT Short Term Goals - 06/14/18 1957      OT SHORT TERM GOAL #1   Title  Pt to be ind in HEP for scar management, ROM and strength to  increase use of L hand and wean out of splint     Baseline  no knowledge    Time  3    Period  Weeks    Status  New    Target Date  07/05/18        OT Long Term Goals - 06/14/18 1958      OT LONG TERM GOAL #1   Title  L wrist AROM improve to [facility] to be able to use in more than 50% of ADL's  with no increase symptoms     Baseline  pain at rest 3/10 and wrist AROM impaired in all planes -     Time  4    Period  Weeks    Status  New    Target Date  07/12/18      OT LONG TERM GOAL #2   Title  Grip strength in L hand increase to more than 50% compare to R to be able to carry more than 5 lbs and cut with knife     Baseline  NT - pt only 11 days s/p    Time  6    Period  Weeks    Status  New    Target Date  07/26/18      OT LONG TERM GOAL #3   Title  Wrist strength increase to 4+/5 to turn doorknob, push door , drive with L hand with no increase symptoms     Baseline  NT - only 11 days s/p    Time  8    Period  Weeks    Status  New    Target Date  08/09/18            Plan - 06/21/18 0853    Clinical Impression Statement  Pt had stitches removed last Friday - still one in place at end of session - pt ed on scar  management - can start about Thursday - 3 wks s/p - and then can do gentle AROM for wrist in  all planes - with pull less than 2/10 at 4 wks s/p - will follow up with him after next appt with PA - and about 5 wks s/p to start ROM     Occupational performance deficits (Please refer to evaluation for details):  ADL's;IADL's;Work;Play;Leisure    Rehab Potential  Good    OT Frequency  --   1-2 x wk   OT Duration  8 weeks    OT Treatment/Interventions  Therapeutic exercise;Self-care/ADL training;Contrast Bath;Fluidtherapy;Patient/family education;Scar mobilization;Passive range of motion;Manual Therapy    Plan  assess scar and start AROM for wrist     Clinical Decision Making  Several treatment options, min-mod task modification necessary    OT Home Exercise Plan  see flowsheet    Consulted and Agree with Plan of Care  Patient       Patient will benefit from skilled therapeutic intervention in order to improve the following deficits and impairments:  Pain, Impaired flexibility, Increased edema, Decreased scar mobility, Decreased range of motion, Decreased strength, Impaired UE functional use  Visit Diagnosis: Pain in left wrist  Stiffness of left wrist, not elsewhere classified  Muscle weakness (generalized)  Scar condition and fibrosis of skin    Problem List There are no active problems to display for this patient.   Rosalyn Gess OTR/L,CLT 06/21/2018, 8:55 AM  [place] [facility] [address], [place], [address] Phone: [phone]   Fax:  [phone]  Name: ZAKARIYYA HELFMAN MRN: [id number] Date of Birth: 01/27/1971

## 2018-07-13 ENCOUNTER — Ambulatory Visit: Payer: Managed Care, Other (non HMO) | Admitting: Occupational Therapy

## 2018-07-13 DIAGNOSIS — M25532 Pain in left wrist: Secondary | ICD-10-CM

## 2018-07-13 DIAGNOSIS — L905 Scar conditions and fibrosis of skin: Secondary | ICD-10-CM

## 2018-07-13 DIAGNOSIS — M6281 Muscle weakness (generalized): Secondary | ICD-10-CM

## 2018-07-13 DIAGNOSIS — M25632 Stiffness of left wrist, not elsewhere classified: Secondary | ICD-10-CM

## 2018-07-13 NOTE — Patient Instructions (Signed)
Heat  AAROM for wrist extention , flexion , RD, UD over edge of table   10 reps  slight pull  - less than 2/10 pain  And then Supination wheel AAROM 15 reps  Followed by AROM in all planes  can do xtra 2 x day - prayer stretch for wrist extention and flexion stretch over edge of chair  10 reps  hold 3 sec

## 2018-07-13 NOTE — Therapy (Signed)
[place] [facility] [address], [place], [address] Phone: [phone]   Fax:  [phone]  Occupational Therapy Treatment  Patient Details  Name: Bradley Sexton MRN: [id number] Date of Birth: 1971/09/05 No data recorded  Encounter Date: 07/13/2018  OT End of Session - 07/13/18 1048    Visit Number  3    Number of Visits  12    Date for OT Re-Evaluation  08/09/18    OT Start Time  0805    OT Stop Time  0842    OT Time Calculation (min)  37 min    Activity Tolerance  Patient tolerated treatment well    Behavior During Therapy  [facility] for tasks assessed/performed       Past Medical History:  Diagnosis Date  . Atrial fibrillation ([place]) 2016   X 1 WHEN GASSED AT [employer]  . Hypertension     Past Surgical History:  Procedure Laterality Date  . NO PAST SURGERIES    . OPEN REDUCTION INTERNAL FIXATION (ORIF) DISTAL RADIAL FRACTURE Left 06/03/2018   Procedure: OPEN REDUCTION INTERNAL FIXATION (ORIF) DISTAL RADIAL FRACTURE;  Surgeon: Hessie Knows, MD;  Location: [facility] ORS;  Service: Orthopedics;  Laterality: Left;    There were no vitals filed for this visit.  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  No/denies         [facility] OT Assessment - 07/13/18 0001      AROM   Left Forearm Supination  35 Degrees    Left Wrist Extension  45 Degrees    Left Wrist Flexion  44 Degrees    Left Wrist Radial Deviation  24 Degrees    Left Wrist Ulnar Deviation  25 Degrees      Measurements taken for wrist in all planes   Pt cont to wear prefab wrist splint            OT Treatments/Exercises (OP) - 07/13/18  0001      LUE Fluidotherapy   Number Minutes Fluidotherapy  10 Minutes    LUE Fluidotherapy Location  Wrist    Comments  AROM in all planes at [facility] prior to [facility]      assess scar - done some sweeping with graston tool nr 2 over volar and dorsal forearm  Scar moving great    AAROM for wrist extention , flexion , RD, UD over edge of table  Done - pt need min A   10 reps  slight pull  - less than 2/10 pain should be felt  And then Supination wheel AAROM at 90 degrees elbow flexion  15 reps on table   Followed by AROM in all planes  can do xtra 2 x day - Review to do  prayer stretch for wrist extention and flexion stretch over edge of chair  10 reps  hold 3 sec  Slight pull - less than 2/10         OT Education - 07/13/18 1048    Education Details  upgrade HEP  to [facility] for wrist in all planes     Person(s) Educated  Patient    Methods  Explanation;Demonstration;Handout    Comprehension  Verbalized understanding;Returned demonstration       OT Short Term Goals - 07/13/18 1051      OT SHORT TERM GOAL #1   Title  Pt to be ind in HEP for scar management, ROM and strength to increase use of L hand and wean out of splint     Baseline  progress in scar , AAROM initiated tihs date- - but still need splint     Time  3    Period  Weeks    Status  On-going    Target Date  08/03/18        OT Long Term Goals - 07/13/18 1051      OT LONG TERM GOAL #1   Title  L wrist AROM improve to [facility] to be able to use in more than 50% of ADL's  with no increase symptoms     Baseline  intiated AAROM this date- pt in 2 days 6 wks s/p     Time  4    Period  Weeks    Status  On-going    Target Date  08/10/18      OT LONG TERM GOAL #2   Title  Grip strength in L hand increase to more than 50% compare to R to be able to carry more than 5 lbs and cut with knife     Baseline  NT - in 2 days 6 wks s/p     Time  6    Period  Weeks    Status  On-going    Target Date  08/24/18      OT LONG  TERM GOAL #3   Title  Wrist strength increase to 4+/5 to turn doorknob, push door , drive with L hand with no increase symptoms     Baseline  NT - in 2 days 6 wks s/p     Time  6    Period  Weeks    Status  On-going    Target Date  08/24/18            Plan - 07/13/18 1049    Clinical Impression Statement  Pt is in 2 days  6 wks s/p ORIF of L distal radius fx - initiated this date AAROM to wrist in all planes - pt to keep stretch under 2/10 - last xray showed good healing and hard wear in place - pt folllowing up with surgeon again on Monday     Occupational performance deficits (Please refer to evaluation for details):  ADL's;IADL's;Work;Play;Leisure    Rehab Potential  Good    OT Frequency  --   1-2 x wk    OT Duration  4 weeks    OT Treatment/Interventions  Therapeutic exercise;Self-care/ADL training;Contrast Bath;Fluidtherapy;Patient/family education;Scar mobilization;Passive range of motion;Manual Therapy    Plan  assess progress and tolerance for AAROM to wrist     Clinical Decision Making  Several treatment options, min-mod task modification necessary    OT Home Exercise Plan  see flowsheet    Consulted and Agree with Plan of Care  Patient       Patient will benefit from skilled therapeutic intervention in order to improve the following deficits and impairments:  Pain, Impaired flexibility, Increased edema, Decreased scar mobility, Decreased range of motion, Decreased strength, Impaired UE functional use  Visit Diagnosis: Pain in left  wrist  Stiffness of left wrist, not elsewhere classified  Muscle weakness (generalized)  Scar condition and fibrosis of skin    Problem List There are no active problems to display for this patient.   Rosalyn Gess OTR/L,CLT 07/13/2018, 10:53 AM  [place] [facility] [address], [place], [address] Phone: [phone]   Fax:  [phone]  Name: Bradley Sexton MRN: [id number] Date of Birth: June 28, 1971

## 2018-07-19 ENCOUNTER — Ambulatory Visit: Payer: Managed Care, Other (non HMO) | Attending: Orthopedic Surgery | Admitting: Occupational Therapy

## 2018-07-19 DIAGNOSIS — L905 Scar conditions and fibrosis of skin: Secondary | ICD-10-CM | POA: Diagnosis present

## 2018-07-19 DIAGNOSIS — M6281 Muscle weakness (generalized): Secondary | ICD-10-CM | POA: Insufficient documentation

## 2018-07-19 DIAGNOSIS — M25532 Pain in left wrist: Secondary | ICD-10-CM | POA: Diagnosis not present

## 2018-07-19 DIAGNOSIS — M25632 Stiffness of left wrist, not elsewhere classified: Secondary | ICD-10-CM | POA: Insufficient documentation

## 2018-07-19 NOTE — Patient Instructions (Signed)
Add this date - 16oz hammer / 1 lbs for wrist in all planes  10-12 reps  Pain free And putty teal  10-12 reps  grip , lat and 3 point grip   2 x day  All and pain less than 2/10 pain  Cont prior to this with ROM HEP

## 2018-07-19 NOTE — Therapy (Signed)
[place] [facility] [address] [place], [address] Phone: [phone]   Fax:  [phone]  Occupational Therapy Treatment  Patient Details  Name: Bradley Sexton MRN: [id number] Date of Birth: [birth year] No data recorded  Encounter Date: 07/19/2018  OT End of Session - 07/19/18 8270    Visit Number  4    Number of Visits  12    Date for OT Re-Evaluation  08/09/18    OT Start Time  0806    OT Stop Time  0855    OT Time Calculation (min)  49 min    Activity Tolerance  Patient tolerated treatment well    Behavior During Therapy  [facility] for tasks assessed/performed       Past Medical History:  Diagnosis Date  . Atrial fibrillation ([place]) 2016   X 1 WHEN GASSED AT [employer]  . Hypertension     Past Surgical History:  Procedure Laterality Date  . NO PAST SURGERIES    . OPEN REDUCTION INTERNAL FIXATION (ORIF) DISTAL RADIAL FRACTURE Left 06/03/2018   Procedure: OPEN REDUCTION INTERNAL FIXATION (ORIF) DISTAL RADIAL FRACTURE;  Surgeon: Hessie Knows, MD;  Location: [facility] ORS;  Service: Orthopedics;  Laterality: Left;    There were no vitals filed for this visit.  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  No/denies         [facility] OT Assessment - 07/19/18 0001      AROM   Left Forearm Supination  55 Degrees    Left Wrist Extension  50 Degrees    Left Wrist Flexion  50 Degrees    Left Wrist Radial Deviation  25 Degrees    Left Wrist Ulnar Deviation  20 Degrees      Strength   Right Hand Grip (lbs)  114    Right Hand Lateral Pinch  22 lbs    Right Hand 3 Point Pinch  25 lbs    Left Hand Grip (lbs)  35    Left Hand Lateral Pinch  16 lbs    Left Hand 3  Point Pinch  15 lbs       Assess wrist ROM and grip /prehension strength - see flowsheet        OT Treatments/Exercises (OP) - 07/19/18 0001      LUE Fluidotherapy   Number Minutes Fluidotherapy  10 Minutes    LUE Fluidotherapy Location  Wrist    Comments  AROM in all planes to decrease stiffness        assess scar - done some sweeping with graston tool nr 2 over volar and dorsal forearm  Scar moving great    AAROM for wrist extention  Prayer stretch - and wrist flexion over armrest  10 reps  RD, UD over edge of table  Done - pt need min A to cont with   10 reps  slight pull  - less than 2/10 pain   Supination wheel AAROM at 90 degrees elbow flexion able to get to 60 degrees afterwards   Done CPM for wrist extention and flexion 200 sec  -  16oz hammer / 1 lbs for wrist in all planes  10-12 reps  Pain free   putty teal  10-12 reps  grip , lat and 3 point grip   2 x day  All and pain less than 2/10 pain  Cont prior to this with ROM HEP        OT Education - 07/19/18 0821    Education Details  upgrade HEP and add light strengthening - pt 6 1/2 wks s/p    Person(s) Educated  Patient    Methods  Explanation;Demonstration;Handout    Comprehension  Verbalized understanding;Returned demonstration       OT Short Term Goals - 07/13/18 1051      OT SHORT TERM GOAL #1   Title  Pt to be ind in HEP for scar management, ROM and strength to increase use of L hand and wean out of splint     Baseline  progress in scar , AAROM initiated tihs date- - but still need splint     Time  3    Period  Weeks    Status  On-going    Target Date  08/03/18        OT Long Term Goals - 07/13/18 1051      OT LONG TERM GOAL #1   Title  L wrist AROM improve to [facility] to be able to use in more than 50% of ADL's  with no increase symptoms     Baseline  intiated AAROM this date- pt in 2 days 6 wks s/p     Time  4    Period  Weeks    Status  On-going    Target Date  08/10/18       OT LONG TERM GOAL #2   Title  Grip strength in L hand increase to more than 50% compare to R to be able to carry more than 5 lbs and cut with knife     Baseline  NT - in 2 days 6 wks s/p     Time  6    Period  Weeks    Status  On-going    Target Date  08/24/18      OT LONG TERM GOAL #3   Title  Wrist strength increase to 4+/5 to turn doorknob, push door , drive with L hand with no increase symptoms     Baseline  NT - in 2 days 6 wks s/p     Time  6    Period  Weeks    Status  On-going    Target Date  08/24/18            Plan - 07/19/18 2993    Clinical Impression Statement  Pt is 6 1/2 wks s/p ORIF L distal radius fx - pt show increase AROM in L wrist in all planes - intitiated this week light strengthening for wrist and grip - pt tolerating it well - pt to see MD  later  today     Occupational performance deficits (Please refer to evaluation for details):  ADL's;IADL's;Work;Play;Leisure    Rehab Potential  Good    OT Frequency  --   1-2 x wk    OT Duration  4 weeks    OT Treatment/Interventions  Therapeutic exercise;Self-care/ADL training;Contrast Bath;Fluidtherapy;Patient/family education;Scar mobilization;Passive range of motion;Manual Therapy    Plan  assess progress and tolerance for AAROM to wrist     Clinical Decision Making  Several treatment options, min-mod task modification necessary  OT Home Exercise Plan  see flowsheet    Consulted and Agree with Plan of Care  Patient       Patient will benefit from skilled therapeutic intervention in order to improve the following deficits and impairments:  Pain, Impaired flexibility, Increased edema, Decreased scar mobility, Decreased range of motion, Decreased strength, Impaired UE functional use  Visit Diagnosis: Pain in left wrist  Stiffness of left wrist, not elsewhere classified  Muscle weakness (generalized)  Scar condition and fibrosis of skin    Problem List There are no active problems to display for this  patient.   Rosalyn Gess OTR/L,CLT 07/19/2018, 9:09 AM  [place] [facility] [address] [place], [address] Phone: [phone]   Fax:  [phone]  Name: ARMAS MCBEE MRN: [id number] Date of Birth: 28-Jul-1971

## 2018-07-22 ENCOUNTER — Ambulatory Visit: Payer: Managed Care, Other (non HMO) | Admitting: Occupational Therapy

## 2018-07-22 DIAGNOSIS — M25532 Pain in left wrist: Secondary | ICD-10-CM

## 2018-07-22 DIAGNOSIS — M25632 Stiffness of left wrist, not elsewhere classified: Secondary | ICD-10-CM

## 2018-07-22 DIAGNOSIS — M6281 Muscle weakness (generalized): Secondary | ICD-10-CM

## 2018-07-22 DIAGNOSIS — L905 Scar conditions and fibrosis of skin: Secondary | ICD-10-CM

## 2018-07-22 NOTE — Patient Instructions (Addendum)
Add this date pulling and twisting 10 reps to putty   and increase to 2 sets and then 3 sets - 10 reps  grip , lat and 3 point grip If pain free  And cont with 1 lbs or 16 oz hammer pain free but do RD, UD to side of body  10 reps  and increase next week to 2 and then 3 sets

## 2018-07-22 NOTE — Therapy (Signed)
[place] [facility] [address] [place], [address] Phone: [phone]   Fax:  [phone]  Occupational Therapy Treatment  Patient Details  Name: Bradley Sexton MRN: [id number] Date of Birth: [birth year] No data recorded  Encounter Date: 07/22/2018  OT End of Session - 07/22/18 0827    Visit Number  5    Number of Visits  12    Date for OT Re-Evaluation  08/09/18    OT Start Time  0816    OT Stop Time  0900    OT Time Calculation (min)  44 min    Activity Tolerance  Patient tolerated treatment well    Behavior During Therapy  [facility] for tasks assessed/performed       Past Medical History:  Diagnosis Date  . Atrial fibrillation ([place]) 2016   X 1 WHEN GASSED AT [employer]  . Hypertension     Past Surgical History:  Procedure Laterality Date  . NO PAST SURGERIES    . OPEN REDUCTION INTERNAL FIXATION (ORIF) DISTAL RADIAL FRACTURE Left 06/03/2018   Procedure: OPEN REDUCTION INTERNAL FIXATION (ORIF) DISTAL RADIAL FRACTURE;  Surgeon: Hessie Knows, MD;  Location: [facility] ORS;  Service: Orthopedics;  Laterality: Left;    There were no vitals filed for this visit.  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  No/denies         [facility] OT Assessment - 07/22/18 0001      AROM   Left Forearm Supination  62 Degrees    Left Wrist Extension  53 Degrees    Left Wrist Flexion  53 Degrees    Left Wrist Radial Deviation  25 Degrees    Left Wrist Ulnar Deviation  20 Degrees       assess AROM at wrist - cont to make progress  Scar doing well - did tape pt after fluido and ed pt on doing at home   kinesiotape - 30% pull parallel to scar and across 100% pull - x 2         OT Treatments/Exercises (OP) - 07/22/18 0001      LUE Fluidotherapy   Number Minutes Fluidotherapy  10 Minutes    LUE Fluidotherapy Location  Wrist    Comments  AROM at [facility] to increase ROM          assess scar - done some sweeping with graston tool nr 2 over volar and dorsal forearm  Scar moving great  AAROM for wrist extention  Prayer stretch - and wrist flexion over armrest  10 reps  RD, UD over edge of tableDone - pt need min Ato cont with  10 reps slight pull - less than 2/10 pain   Supination wheel AAROMat 90 degrees elbow flexion- cont to use at home   Done CPM for  Wrist extention 200 sec  And repeat wrist extention and flexion 200 sec  - 16oz hammer / 1 lbs for wrist in all planes  10-12 reps  Pain free  pt hold hammer in middle for sup /pro - no support and no pain  And RD, UD move to do next to side - getting more AROM  Pt can in the next week increase to 2 and 3 sets of 10 for 1 lbs weight - but pain free    putty teal  10-12 reps  grip   2 x day   can increase to 2-3 sets pain free over the week  Add pulling and twisting will all digits - 10 reps  - pain free  All and pain less than 2/10 pain         OT Education - 07/22/18 0827    Education Details  increase sets of 1 lbs weight and teal putty     Person(s) Educated  Patient    Methods  Explanation;Demonstration;Handout    Comprehension  Verbalized understanding;Returned demonstration       OT Short Term Goals - 07/13/18 1051      OT SHORT TERM GOAL #1   Title  Pt to be ind in HEP for scar management, ROM and strength to increase use of L hand and wean out of splint     Baseline  progress in scar , AAROM initiated tihs date- - but still need splint     Time  3    Period  Weeks    Status  On-going    Target Date  08/03/18        OT Long Term Goals - 07/13/18 1051      OT LONG TERM GOAL #1    Title  L wrist AROM improve to [facility] to be able to use in more than 50% of ADL's  with no increase symptoms     Baseline  intiated AAROM this date- pt in 2 days 6 wks s/p     Time  4    Period  Weeks    Status  On-going    Target Date  08/10/18      OT LONG TERM GOAL #2   Title  Grip strength in L hand increase to more than 50% compare to R to be able to carry more than 5 lbs and cut with knife     Baseline  NT - in 2 days 6 wks s/p     Time  6    Period  Weeks    Status  On-going    Target Date  08/24/18      OT LONG TERM GOAL #3   Title  Wrist strength increase to 4+/5 to turn doorknob, push door , drive with L hand with no increase symptoms     Baseline  NT - in 2 days 6 wks s/p     Time  6    Period  Weeks    Status  On-going    Target Date  08/24/18            Plan - 07/22/18 7619    Clinical Impression Statement  Pt is 7 wks s/p ORIF of L distal radius fx - ulna healing well but radius still slow to heal - pt to cont to wear splint - per visit with PA since last time - cont iwth increase ROM and strenght - but ptt to cont have less than 2/10 pain - pt cont to show increase ROM - and strength- pt to work on increase sets  with same resistance - and increase ROM     Occupational  performance deficits (Please refer to evaluation for details):  ADL's;IADL's;Work;Play;Leisure    Rehab Potential  Good    OT Frequency  --   1-2 x wk   OT Duration  4 weeks    OT Treatment/Interventions  Therapeutic exercise;Self-care/ADL training;Contrast Bath;Fluidtherapy;Patient/family education;Scar mobilization;Passive range of motion;Manual Therapy    Plan  assess progress and tolerance for AAROM to wrist     Clinical Decision Making  Several treatment options, min-mod task modification necessary    OT Home Exercise Plan  see flowsheet    Consulted and Agree with Plan of Care  Patient       Patient will benefit from skilled therapeutic intervention in order to improve the following  deficits and impairments:  Pain, Impaired flexibility, Increased edema, Decreased scar mobility, Decreased range of motion, Decreased strength, Impaired UE functional use  Visit Diagnosis: Pain in left wrist  Stiffness of left wrist, not elsewhere classified  Muscle weakness (generalized)  Scar condition and fibrosis of skin    Problem List There are no active problems to display for this patient.   Rosalyn Gess OTR/L,CLT 07/22/2018, 6:09 PM  [place] [facility] [address], [place], [address] Phone: [phone]   Fax:  [phone]  Name: Bradley Sexton MRN: [id number] Date of Birth: 1970-10-03

## 2018-07-30 ENCOUNTER — Ambulatory Visit: Payer: Managed Care, Other (non HMO) | Admitting: Occupational Therapy

## 2018-07-30 DIAGNOSIS — M25532 Pain in left wrist: Secondary | ICD-10-CM

## 2018-07-30 DIAGNOSIS — L905 Scar conditions and fibrosis of skin: Secondary | ICD-10-CM

## 2018-07-30 DIAGNOSIS — M6281 Muscle weakness (generalized): Secondary | ICD-10-CM

## 2018-07-30 DIAGNOSIS — M25632 Stiffness of left wrist, not elsewhere classified: Secondary | ICD-10-CM

## 2018-07-30 NOTE — Patient Instructions (Signed)
Same HEP for PROM and supination wheel Upgrade to 2 lbs weight for 10 reps  in all planes  And increase to 2 sets in 3 days if no pain   Upgrade grip to green putty  12 reps and increase to 2 sets again in few days 3 point and lat grip -to be done in teal putty still - 12 reps  2 sets pain free

## 2018-07-30 NOTE — Therapy (Signed)
[place] [facility] [address] [place], [address] Phone: [phone]   Fax:  [phone]  Occupational Therapy Treatment  Patient Details  Name: Bradley Sexton MRN: [id number] Date of Birth: 1971-04-05 No data recorded  Encounter Date: 07/30/2018  OT End of Session - 07/30/18 0825    Visit Number  6    Number of Visits  12    Date for OT Re-Evaluation  08/09/18    OT Start Time  0805    OT Stop Time  0849    OT Time Calculation (min)  44 min    Activity Tolerance  Patient tolerated treatment well    Behavior During Therapy  [facility] for tasks assessed/performed       Past Medical History:  Diagnosis Date  . Atrial fibrillation ([place]) 2016   X 1 WHEN GASSED AT [employer]  . Hypertension     Past Surgical History:  Procedure Laterality Date  . NO PAST SURGERIES    . OPEN REDUCTION INTERNAL FIXATION (ORIF) DISTAL RADIAL FRACTURE Left 06/03/2018   Procedure: OPEN REDUCTION INTERNAL FIXATION (ORIF) DISTAL RADIAL FRACTURE;  Surgeon: Hessie Knows, MD;  Location: [facility] ORS;  Service: Orthopedics;  Laterality: Left;    There were no vitals filed for this visit.  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  No/denies         [facility] OT Assessment - 07/30/18 0001      AROM   Left Forearm Supination  65 Degrees    Left Wrist Extension  55 Degrees    Left Wrist Flexion  55 Degrees      Strength   Left Hand Grip (lbs)  55    Left Hand Lateral Pinch  18 lbs    Left Hand 3 Point Pinch  16 lbs       cont to make progress - did get 80 degrees in sup after parafin , AAROM and supination wheel    assess AROM at wrist - cont to make progress  Scar doing  well - provided xtra tape for pt to cont at home   kinesiotape - 30% pull parallel to scar and across 100% pull - x 2       OT Treatments/Exercises (OP) - 07/30/18 0001      LUE Paraffin   Number Minutes Paraffin  10 Minutes    LUE Paraffin Location  Wrist    Comments  sup , flexion and extention stretch for 30 sec to 1 min few times         AAROM for wrist extentionPrayer stretch - and wrist flexion over armrest  10 reps RD, UD with hand on table - feel pull into UD  10 reps slight pull - less than 2/10 pain  Supination wheel AAROMat 90 degrees elbow flexion- cont to use at home  and got in clinic this date 80 degrees   Done CPM for  Wrist flexion /extention 200 sec  2  lbs for wrist in all planes 10-12 reps  Pain free  decrease to 1  set and increase in few days if pain free 2 sets   putty upgrade to green for gripping   10-12 reps 2 x day   can increase to 2-3 sets pain free over the week  Add this date to teal putty again lat and 3 point grip - 12 reps   pain less than 2/10 pain        OT Education - 07/30/18 0824    Education Details  2 lbs for wrist and increase putty to med  green    Person(s) Educated  Patient    Methods  Explanation;Demonstration;Handout    Comprehension  Verbalized understanding;Returned demonstration       OT Short Term Goals - 07/13/18 1051      OT SHORT TERM GOAL #1   Title  Pt to be ind in HEP for scar management, ROM and strength to increase use of L hand and wean out of splint     Baseline  progress in scar , AAROM initiated tihs date- - but still need splint     Time  3    Period  Weeks    Status  On-going    Target Date  08/03/18        OT Long Term Goals - 07/13/18 1051      OT LONG TERM GOAL #1   Title  L wrist AROM improve to [facility] to be able to use in more than 50% of ADL's  with no increase symptoms     Baseline  intiated AAROM this date- pt in 2 days 6 wks s/p     Time  4    Period  Weeks     Status  On-going    Target Date  08/10/18      OT LONG TERM GOAL #2   Title  Grip strength in L hand increase to more than 50% compare to R to be able to carry more than 5 lbs and cut with knife     Baseline  NT - in 2 days 6 wks s/p     Time  6    Period  Weeks    Status  On-going    Target Date  08/24/18      OT LONG TERM GOAL #3   Title  Wrist strength increase to 4+/5 to turn doorknob, push door , drive with L hand with no increase symptoms     Baseline  NT - in 2 days 6 wks s/p     Time  6    Period  Weeks    Status  On-going    Target Date  08/24/18            Plan - 07/30/18 1355    Clinical Impression Statement  Pt is 8 wks s/p ORIF of L distal radius fz - ulna healing well - radius still slow to heal - pt did report that pain or strain not as bad anymore felt thru HEP on radial side - did add this date prehension strength HEP - and  pt upgrade to 2 lbs and  putty for grip -  during CPM for wrist extention pt feel block - will cont to assess progress - flexion and sup cont to improve     Occupational performance deficits (Please refer to evaluation for details):  ADL's;IADL's;Work;Play;Leisure    Rehab Potential  Good    OT Frequency  --   1-2 x wk   OT Duration  4 weeks  OT Treatment/Interventions  Therapeutic exercise;Self-care/ADL training;Contrast Bath;Fluidtherapy;Patient/family education;Scar mobilization;Passive range of motion;Manual Therapy    Plan  assess progress and tolerance for 2 lbs weight and harder putty     Clinical Decision Making  Several treatment options, min-mod task modification necessary    OT Home Exercise Plan  see flowsheet    Consulted and Agree with Plan of Care  Patient       Patient will benefit from skilled therapeutic intervention in order to improve the following deficits and impairments:  Pain, Impaired flexibility, Increased edema, Decreased scar mobility, Decreased range of motion, Decreased strength, Impaired UE functional  use  Visit Diagnosis: Pain in left wrist  Stiffness of left wrist, not elsewhere classified  Muscle weakness (generalized)  Scar condition and fibrosis of skin    Problem List There are no active problems to display for this patient.   Rosalyn Gess OTR/L,CLT 07/30/2018, 2:00 PM  [place] [facility] [address] [place], [address] Phone: [phone]   Fax:  [phone]  Name: FERNANDO STOIBER MRN: [id number] Date of Birth: November 27, 1970

## 2018-08-05 ENCOUNTER — Ambulatory Visit: Payer: Managed Care, Other (non HMO) | Admitting: Occupational Therapy

## 2018-08-05 DIAGNOSIS — L905 Scar conditions and fibrosis of skin: Secondary | ICD-10-CM

## 2018-08-05 DIAGNOSIS — M25532 Pain in left wrist: Secondary | ICD-10-CM

## 2018-08-05 DIAGNOSIS — M25632 Stiffness of left wrist, not elsewhere classified: Secondary | ICD-10-CM

## 2018-08-05 DIAGNOSIS — M6281 Muscle weakness (generalized): Secondary | ICD-10-CM

## 2018-08-05 NOTE — Patient Instructions (Signed)
Pt to do prolonged sup , wrist flexion and extention stretches with forearm and wrist  In heatingpad 1-2 min at time  Several times  and decrease to 1 lbs again - but 3 sets  And cont green putty - grip , lat and 3 point grip  3 sets of 10 -15 reps  if no increase pain

## 2018-08-05 NOTE — Therapy (Signed)
[facility] [address], [place], [address] Phone: [phone]   Fax:  [phone]  Occupational Therapy Treatment  Patient Details  Name: BOBBY BARTON MRN: [id number] Date of Birth: 07/23/1971 No data recorded  Encounter Date: 08/05/2018  OT End of Session - 08/05/18 0925    Visit Number  7    Number of Visits  12    Date for OT Re-Evaluation  08/09/18    OT Start Time  0818    OT Stop Time  0913    OT Time Calculation (min)  55 min    Activity Tolerance  Patient tolerated treatment well    Behavior During Therapy  [facility] for tasks assessed/performed       Past Medical History:  Diagnosis Date  . Atrial fibrillation ([place]) 2016   X 1 WHEN GASSED AT [employer]  . Hypertension     Past Surgical History:  Procedure Laterality Date  . NO PAST SURGERIES    . OPEN REDUCTION INTERNAL FIXATION (ORIF) DISTAL RADIAL FRACTURE Left 06/03/2018   Procedure: OPEN REDUCTION INTERNAL FIXATION (ORIF) DISTAL RADIAL FRACTURE;  Surgeon: Hessie Knows, MD;  Location: [facility] ORS;  Service: Orthopedics;  Laterality: Left;    There were no vitals filed for this visit.  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  Yes         [facility] OT Assessment - 08/05/18 0001      AROM   Left Forearm Supination  75 Degrees    Left Wrist Extension  55 Degrees    Left Wrist Flexion  50 Degrees       Pt AROM about the same -supination did improve - but pt's R supination appear to be the same 75          OT Treatments/Exercises (OP) -  08/05/18 0001      LUE Paraffin   Number Minutes Paraffin  12 Minutes    LUE Paraffin Location  Wrist;Forearm    Comments  done prolonged stretch for wrist ext, flexion ,sup several times      Done 2 min stretches on wrist ext, flexion and sup - 4 x 1-2 min  And pt to do the same - pt to do wrist extention stretch with and in fist - to make sure feel slight pull on volar wrist and not a jam on top of wrist  Pt to hold off on 2 lbs weight  And can stop supination wheel   1lbs weight or 16 oz hammer for in all planes   3 sets of 10 -15 reps   And cont with green putty - gripping did not bother him  And do also like this past week lat and 3 point grip         OT Education - 08/05/18 0925    Education Details  focus on prolonged stretches for wrist in  all planes -and decrease to 1 lbs - do wrist extention stretch with hand in loose fist     Person(s) Educated  Patient    Methods  Explanation;Demonstration;Handout    Comprehension  Verbalized understanding;Returned demonstration       OT Short Term Goals - 08/05/18 0930      OT SHORT TERM GOAL #1   Title  Pt to be ind in HEP for scar management, ROM and strength to increase use of L hand and wean out of splint     Status  Achieved        OT Long Term Goals - 08/05/18 0930      OT LONG TERM GOAL #1   Title  L wrist AROM improve to [facility] to be able to use in more than 50% of ADL's  with no increase symptoms     Baseline  AROM improving - wrist extention and flexion still decrease mostly - but using L hand in ADL's     Time  2    Period  Weeks    Status  On-going    Target Date  08/10/18      OT LONG TERM GOAL #2   Title  Grip strength in L hand increase to more than 50% compare to R to be able to carry more than 5 lbs and cut with knife     Baseline  Grip strength L 45 this date ,and R 114 lbs     Time  4    Period  Weeks    Status  On-going    Target Date  08/24/18      OT LONG TERM GOAL #3   Title  Wrist strength  increase to 4+/5 to turn doorknob, push door , drive with L hand with no increase symptoms     Baseline  able to do 1 lbs - but had some pain with 2 lbs this past few days     Time  4    Period  Weeks    Status  On-going    Target Date  08/24/18            Plan - 08/05/18 0926    Clinical Impression Statement  Pt is 9 wks s/p ORIF of L distal radius fx - pt was upgraded to 2 lbs last time - pt report some discomfort and pain with 2 lbs on radius head side - this date decrease pt to 1 lbs but 3 sets again and keep him at green med putty - but pt to focus on prolonged stretchtes for sup , wrist ext and flexion- 1-2 min few times -   2 x day - when assess pt R wrist sup is about 75 degrees too     Occupational performance deficits (Please refer to evaluation for details):  ADL's;IADL's;Work;Play;Leisure    Rehab Potential  Good    OT Frequency  1x / week    OT Duration  4 weeks    OT Treatment/Interventions  Therapeutic exercise;Self-care/ADL training;Contrast Bath;Fluidtherapy;Patient/family education;Scar mobilization;Passive range of motion;Manual Therapy    Plan  assess progress with ROM -and tolerance to 1 lbs for 3 sets and green putty     Clinical Decision Making  Several treatment options, min-mod task modification necessary    OT Home Exercise Plan  see flowsheet    Consulted and Agree with Plan of Care  Patient       Patient will benefit from skilled therapeutic intervention in order to  improve the following deficits and impairments:  Pain, Impaired flexibility, Increased edema, Decreased scar mobility, Decreased range of motion, Decreased strength, Impaired UE functional use  Visit Diagnosis: Pain in left wrist  Stiffness of left wrist, not elsewhere classified  Muscle weakness (generalized)  Scar condition and fibrosis of skin    Problem List There are no active problems to display for this patient.   Rosalyn Gess 08/05/2018, 9:34 AM  [place] [facility] [address] [place], [address] Phone: [phone]   Fax:  [phone]  Name: EMRIC KOWALEWSKI MRN: [id number] Date of Birth: 07-Aug-1971

## 2018-08-11 ENCOUNTER — Ambulatory Visit: Payer: Managed Care, Other (non HMO) | Admitting: Occupational Therapy

## 2018-08-11 DIAGNOSIS — M25632 Stiffness of left wrist, not elsewhere classified: Secondary | ICD-10-CM

## 2018-08-11 DIAGNOSIS — L905 Scar conditions and fibrosis of skin: Secondary | ICD-10-CM

## 2018-08-11 DIAGNOSIS — M6281 Muscle weakness (generalized): Secondary | ICD-10-CM

## 2018-08-11 DIAGNOSIS — M25532 Pain in left wrist: Secondary | ICD-10-CM

## 2018-08-11 NOTE — Therapy (Signed)
[facility] [address], [place], [address] Phone: [phone]   Fax:  [phone]  Occupational Therapy Treatment  Patient Details  Name: Bradley Sexton MRN: [id number] Date of Birth: 01-Mar-1971 No data recorded  Encounter Date: 08/11/2018  OT End of Session - 08/11/18 1443    Visit Number  8    Number of Visits  14    Date for OT Re-Evaluation  09/08/18    OT Start Time  1345    OT Stop Time  1431    OT Time Calculation (min)  46 min    Activity Tolerance  Patient tolerated treatment well    Behavior During Therapy  [facility] for tasks assessed/performed       Past Medical History:  Diagnosis Date  . Atrial fibrillation ([place]) 2016   X 1 WHEN GASSED AT [employer]  . Hypertension     Past Surgical History:  Procedure Laterality Date  . NO PAST SURGERIES    . OPEN REDUCTION INTERNAL FIXATION (ORIF) DISTAL RADIAL FRACTURE Left 06/03/2018   Procedure: OPEN REDUCTION INTERNAL FIXATION (ORIF) DISTAL RADIAL FRACTURE;  Surgeon: Hessie Knows, MD;  Location: [facility] ORS;  Service: Orthopedics;  Laterality: Left;    There were no vitals filed for this visit.  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  Yes    Pain Score  2     Pain Location  Wrist    Pain Orientation  Left    Pain Descriptors / Indicators  Aching    Aggravating Factors   With RD , wrist extention          [facility] OT Assessment - 08/11/18 0001      AROM   Left Forearm Supination  75 Degrees    Left Wrist Extension  60 Degrees    Left  Wrist Flexion  60 Degrees      Strength   Right Hand Grip (lbs)  114    Right Hand Lateral Pinch  22 lbs    Right Hand 3 Point Pinch  25 lbs    Left Hand Grip (lbs)  74    Left Hand Lateral Pinch  20 lbs    Left Hand 3 Point Pinch  20 lbs      Great progress this past week in wrist extention and flexion to 60 degrees with less effort  Supination about the same as other hand - but 75   pt do have some clicking per pt at times at elbow - pt to mention to MD next week  Pt cannot tolerate upgrade last week with 2 lbs and await another xray next week appt          OT Treatments/Exercises (OP) - 08/11/18 0001      LUE Paraffin   Number Minutes Paraffin  10 Minutes    LUE Paraffin Location  Wrist;Forearm    Comments  at [facility] prior to ROM       scar massage done - and tape kinesiotape for pt 30 % parallel and 100% across   Pt to do in over the weekend few times to decrease adhesions  Done this date PROM for wrist extention with hand in fist and pt to drop wrist while doing wrist extention PROM  Great success the last week Cont wrist flexion -and add this date UD stretch with thumb in fist  CPM done for wrist flexion 200sec  And large knob at 2 lbs for RD and UD - each 100 sec And sup 120 sec   1 lbs  No issues       OT Education - 08/11/18 1443    Education Details  focus on prolonged stretches for wrist in all planes -and cont with  1 lbs - do wrist extention stretch with hand in loose fist     Person(s) Educated  Patient    Methods  Explanation;Demonstration;Handout    Comprehension  Verbalized understanding;Returned demonstration       OT Short Term Goals - 08/11/18 1701      OT SHORT TERM GOAL #1   Title  Pt to be ind in HEP for scar management, ROM and strength to increase use of L hand and wean out of splint     Baseline  progressing very well  except strengthening -in thumb spica - await next xray     Time  3    Period  Weeks    Status  On-going    Target  Date  09/01/18        OT Long Term Goals - 08/11/18 1701      OT LONG TERM GOAL #1   Title  L wrist AROM improve to [facility] to be able to use in more than 50% of ADL's  with no increase symptoms     Baseline  AROM improving - wrist extention and flexion still decrease mostly - but using L hand in ADL's     Time  3    Period  Weeks    Status  On-going    Target Date  09/01/18      OT LONG TERM GOAL #2   Title  Grip strength in L hand increase to more than 50% compare to R to be able to carry more than 5 lbs and cut with knife     Baseline  Grip strength L  74 this date ,and R 114 lbs  but had still some discomfort last week with 2 lbs     Time  4    Period  Weeks    Status  On-going    Target Date  09/08/18      OT LONG TERM GOAL #3   Title  Wrist strength increase to 4+/5 to turn doorknob, push door , drive with L hand with no increase symptoms     Baseline  able to do 1 lbs - but had some pain with 2 lbs last week- await next xray     Time  4    Period  Weeks    Status  On-going    Target Date  09/08/18            Plan - 08/11/18 1445    Clinical Impression Statement  Pt is 10 wks s/p ORIF of L distal radius - attempted to upgrade last week to 2 lbs but pain on  radial wrist- pt made this last week great progress in flexion and extention of wrist- supination still tight but about the same as other wrist - but report some clicking at the elbow and pain - grip and 3 point grip increase greatly again this week - pt to see surgeon next  week  and xtray - will attempt to increase strengthening if xray good and pt can tolerate upgrade on weight     Occupational performance deficits (Please refer to evaluation for details):  ADL's;IADL's;Work;Play;Leisure    Rehab Potential  Good    OT Frequency  1x / week    OT Duration  4 weeks    OT Treatment/Interventions  Therapeutic exercise;Self-care/ADL training;Contrast Bath;Fluidtherapy;Patient/family education;Scar mobilization;Passive  range of motion;Manual Therapy    Plan  result from xray , and upgrade to 2 lbs if can     Clinical Decision Making  Several treatment options, min-mod task modification necessary    OT Home Exercise Plan  see flowsheet    Consulted and Agree with Plan of Care  Patient       Patient will benefit from skilled therapeutic intervention in order to improve the following deficits and impairments:  Pain, Impaired flexibility, Increased edema, Decreased scar mobility, Decreased range of motion, Decreased strength, Impaired UE functional use  Visit Diagnosis: Pain in left wrist - Plan: Ot plan of care cert/re-cert  Stiffness of left wrist, not elsewhere classified - Plan: Ot plan of care cert/re-cert  Muscle weakness (generalized) - Plan: Ot plan of care cert/re-cert  Scar condition and fibrosis of skin - Plan: Ot plan of care cert/re-cert    Problem List There are no active problems to display for this patient.   Rosalyn Gess OTR/L, CLT 08/11/2018, 5:06 PM  [place] [facility] [address], [place], [address] Phone: [phone]   Fax:  [phone]  Name: ZEKIEL TORIAN MRN: [id number] Date of Birth: 1970-12-17

## 2018-08-11 NOTE — Patient Instructions (Signed)
Same HEP - but add UD Active  stretch with thumb in fist

## 2018-08-20 ENCOUNTER — Ambulatory Visit: Payer: Managed Care, Other (non HMO) | Attending: Orthopedic Surgery | Admitting: Occupational Therapy

## 2018-08-20 DIAGNOSIS — M25532 Pain in left wrist: Secondary | ICD-10-CM | POA: Diagnosis present

## 2018-08-20 DIAGNOSIS — L905 Scar conditions and fibrosis of skin: Secondary | ICD-10-CM | POA: Insufficient documentation

## 2018-08-20 DIAGNOSIS — M25632 Stiffness of left wrist, not elsewhere classified: Secondary | ICD-10-CM | POA: Diagnosis present

## 2018-08-20 DIAGNOSIS — M6281 Muscle weakness (generalized): Secondary | ICD-10-CM | POA: Diagnosis present

## 2018-08-20 NOTE — Therapy (Signed)
[place] [facility] [address] [place], [address] Phone: [phone]   Fax:  [phone]  Occupational Therapy Treatment  Patient Details  Name: Bradley Sexton MRN: [id number] Date of Birth: [birth year] No data recorded  Encounter Date: 08/20/2018  OT End of Session - 08/20/18 1750    Visit Number  9    Number of Visits  14    Date for OT Re-Evaluation  09/08/18    OT Start Time  1300    OT Stop Time  1353    OT Time Calculation (min)  53 min    Activity Tolerance  Patient tolerated treatment well    Behavior During Therapy  [facility] for tasks assessed/performed       Past Medical History:  Diagnosis Date  . Atrial fibrillation ([place]) 2016   X 1 WHEN GASSED AT [employer]  . Hypertension     Past Surgical History:  Procedure Laterality Date  . NO PAST SURGERIES    . OPEN REDUCTION INTERNAL FIXATION (ORIF) DISTAL RADIAL FRACTURE Left 06/03/2018   Procedure: OPEN REDUCTION INTERNAL FIXATION (ORIF) DISTAL RADIAL FRACTURE;  Surgeon: Hessie Knows, MD;  Location: [facility] ORS;  Service: Orthopedics;  Laterality: Left;    There were no vitals filed for this visit.  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  No/denies         assess wrist AROM - about the same and grip about the same  With Benik neoprene splint - pt able to carry and lift 3 lbs without symptoms - but felt pull at 4 lbs  pt to wean into neoprene Benik splint for functional strength -and with act under 3 lbs   Simulate dancing and him using his L hand to guide - did not had issues - pt to work on simulating doing some  isometric strengthening against wall for wrist in all planes for wrist flexion , ext, RD and UD - 20 reps             OT Treatments/Exercises (OP) - 08/20/18 0001      LUE Paraffin   Number Minutes Paraffin  10 Minutes    LUE Paraffin Location  Wrist;Forearm    Comments  doing stretches for flexion and extention of wrist 30 Sec each         2 lbs weight this date for wrist in all planes  10 reps Had no issues   Pt to do 1 set - increase every 3 days a set -pain free this date  Also done 2 lbs for elbow flexion and extention  and shoulder - 10reps  4 exercises  Pt to add 1/2 dark blue putty strong to green for gripping  And wean into soft neoprene splint at times - to allow more motion and functional strength Done BTE for gripping to 30 lbs 60 sec and increase to 40 lbs 60 sec  And then 120 sec again for gripping at 40lbs  OT Education - 08/20/18 1749    Education Details  able to increase to 2 lbs for wrist and L UE - add simulating dancing act - isometric for wrist all directions - and splint wearing     Person(s) Educated  Patient    Methods  Explanation;Demonstration;Handout    Comprehension  Verbalized understanding;Returned demonstration       OT Short Term Goals - 08/11/18 1701      OT SHORT TERM GOAL #1   Title  Pt to be ind in HEP for scar management, ROM and strength to increase use of L hand and wean out of splint     Baseline  progressing very well  except strengthening -in thumb spica - await next xray     Time  3    Period  Weeks    Status  On-going    Target Date  09/01/18        OT Long Term Goals - 08/11/18 1701      OT LONG TERM GOAL #1   Title  L wrist AROM improve to [facility] to be able to use in more than 50% of ADL's  with no increase symptoms     Baseline  AROM improving - wrist extention and flexion still decrease mostly - but using L hand in ADL's     Time  3    Period  Weeks    Status  On-going    Target Date  09/01/18       OT LONG TERM GOAL #2   Title  Grip strength in L hand increase to more than 50% compare to R to be able to carry more than 5 lbs and cut with knife     Baseline  Grip strength L  74 this date ,and R 114 lbs  but had still some discomfort last week with 2 lbs     Time  4    Period  Weeks    Status  On-going    Target Date  09/08/18      OT LONG TERM GOAL #3   Title  Wrist strength increase to 4+/5 to turn doorknob, push door , drive with L hand with no increase symptoms     Baseline  able to do 1 lbs - but had some pain with 2 lbs last week- await next xray     Time  4    Period  Weeks    Status  On-going    Target Date  09/08/18            Plan - 08/20/18 1753    Clinical Impression Statement  Pt is 11 wks s/p ORIF L distal radius - per pt he is about 80% healed at fx - pt was able to do 2 lbs for wrist in all planes - as well as for L UE strenthening - pt was able to carry 3 lbs with neoprene splint on with out pain - had some discomfort with 4 lbs - pt to wean into neoprene splint for more functional strenght- and  simulate this date dancing and using hand to guide - can do some isometric strengthening  for wrist  to simulate dancing     Occupational performance deficits (Please refer to evaluation for details):  ADL's;IADL's;Work;Play;Leisure    Rehab Potential  Good    OT Frequency  1x / week    OT Duration  4 weeks    OT Treatment/Interventions  Therapeutic exercise;Self-care/ADL training;Contrast Bath;Fluidtherapy;Patient/family education;Scar  mobilization;Passive range of motion;Manual Therapy    Plan  assess progress with 2 lbs weight and putty     Clinical Decision Making  Several treatment options, min-mod task modification necessary    OT Home Exercise Plan  see flowsheet    Consulted and Agree with Plan of Care  Patient       Patient will benefit from skilled therapeutic intervention in order to improve the following deficits and impairments:  Pain, Impaired  flexibility, Increased edema, Decreased scar mobility, Decreased range of motion, Decreased strength, Impaired UE functional use  Visit Diagnosis: Pain in left wrist  Stiffness of left wrist, not elsewhere classified  Muscle weakness (generalized)  Scar condition and fibrosis of skin    Problem List There are no active problems to display for this patient.   Rosalyn Gess OTR/L,CLT 08/20/2018, 6:03 PM  [place] [facility] [address] [place], [address] Phone: [phone]   Fax:  [phone]  Name: Bradley Sexton MRN: [id number] Date of Birth: 08/21/1971

## 2018-08-20 NOTE — Patient Instructions (Signed)
Pt to do some isometric strengthening for wrist in all directions - against wall - as if guiding somebody during dancing  20 reps  all directions  2 lbs weight this date for wrist in all planes  10 reps  1 set - increase every 3 days a set -pain free this date  And 2 lbs for elbow and shoulder - 10reps  4 exercises  And add 1/2 dark blue putty strong to green for gripping  And wean into soft neoprene splint at times - to allow more motion and functional strength

## 2018-08-27 ENCOUNTER — Ambulatory Visit: Payer: Managed Care, Other (non HMO) | Admitting: Occupational Therapy

## 2018-08-27 DIAGNOSIS — M25532 Pain in left wrist: Secondary | ICD-10-CM

## 2018-08-27 DIAGNOSIS — L905 Scar conditions and fibrosis of skin: Secondary | ICD-10-CM

## 2018-08-27 DIAGNOSIS — M6281 Muscle weakness (generalized): Secondary | ICD-10-CM

## 2018-08-27 DIAGNOSIS — M25632 Stiffness of left wrist, not elsewhere classified: Secondary | ICD-10-CM

## 2018-08-27 NOTE — Therapy (Signed)
[place] [facility] [address] [place], [address] Phone: [phone]   Fax:  [phone]  Occupational Therapy Treatment  Patient Details  Name: Bradley Sexton MRN: [id number] Date of Birth: 25-Dec-1970 No data recorded  Encounter Date: 08/27/2018  OT End of Session - 08/27/18 1416    Visit Number  10    Number of Visits  14    Date for OT Re-Evaluation  09/08/18    OT Start Time  0730    OT Stop Time  0815    OT Time Calculation (min)  45 min    Activity Tolerance  Patient tolerated treatment well    Behavior During Therapy  [facility] for tasks assessed/performed       Past Medical History:  Diagnosis Date  . Atrial fibrillation ([place]) 2016   X 1 WHEN GASSED AT [employer]  . Hypertension     Past Surgical History:  Procedure Laterality Date  . NO PAST SURGERIES    . OPEN REDUCTION INTERNAL FIXATION (ORIF) DISTAL RADIAL FRACTURE Left 06/03/2018   Procedure: OPEN REDUCTION INTERNAL FIXATION (ORIF) DISTAL RADIAL FRACTURE;  Surgeon: Hessie Knows, MD;  Location: [facility] ORS;  Service: Orthopedics;  Laterality: Left;    There were no vitals filed for this visit.  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  No/denies         [facility] OT Assessment - 08/27/18 0001      AROM   Left Wrist Extension  65 Degrees   open hand   Left Wrist Flexion  60 Degrees      Strength   Right Hand Grip (lbs)  114    Right Hand Lateral Pinch  22 lbs    Right Hand 3 Point Pinch  25 lbs    Left Hand Grip (lbs)  80    Left Hand Lateral Pinch  22 lbs    Left Hand 3 Point Pinch  20 lbs       Pt showed great  progress this date with using 2 lbs at home for wrist in all planes  And isometric strengthening simulating dancing and steering with that hand  Putty - grip increase - pt no pain this past week with upgrade of HEP  And weaning more out of black wrist splint         OT Treatments/Exercises (OP) - 08/27/18 0001      LUE Paraffin   Number Minutes Paraffin  10 Minutes    LUE Paraffin Location  Wrist;Forearm    Comments  wrist flexion stretch with heatingpad 3 min x 2 - with extention inbetwee       CPM for wrist flexion and extetion done 200 sec  gripper at 50 lbs 80 sec , then decrease to 45 lbs - 70 sec  Had some discomfort at wrist and 4th digit   Assess lift and carry of weight - could do this date 10 lbs without pull or pain at wrist   Upgrade to 3  lbs for HEP   I wrist all planes 3 lbs   but 12 reps   2 x day  And putty up to dark firm blue - 12 reps gripping   2 x day   in 3 days increase to 2 sets 2 x day and another 3 days  3 sets   if pain free  Cont with isometric strengthening        OT Education - 08/27/18 0827    Education Details  increase to 3 lbs and dark blue     Person(s) Educated  Patient    Methods  Explanation;Demonstration;Handout    Comprehension  Verbalized understanding;Returned demonstration       OT Short Term Goals - 08/11/18 1701      OT SHORT TERM GOAL #1   Title  Pt to be ind in HEP for scar management, ROM and strength to increase use of L hand and wean out of splint     Baseline  progressing very well  except strengthening -in thumb spica - await next xray     Time  3    Period  Weeks    Status  On-going    Target Date  09/01/18        OT Long Term Goals - 08/11/18 1701      OT LONG TERM GOAL #1   Title  L wrist AROM improve to [facility] to be able to use in more than 50% of ADL's  with no increase symptoms     Baseline  AROM improving - wrist extention and flexion still decrease mostly - but using L hand in ADL's     Time  3     Period  Weeks    Status  On-going    Target Date  09/01/18      OT LONG TERM GOAL #2   Title  Grip strength in L hand increase to more than 50% compare to R to be able to carry more than 5 lbs and cut with knife     Baseline  Grip strength L  74 this date ,and R 114 lbs  but had still some discomfort last week with 2 lbs     Time  4    Period  Weeks    Status  On-going    Target Date  09/08/18      OT LONG TERM GOAL #3   Title  Wrist strength increase to 4+/5 to turn doorknob, push door , drive with L hand with no increase symptoms     Baseline  able to do 1 lbs - but had some pain with 2 lbs last week- await next xray     Time  4    Period  Weeks    Status  On-going    Target Date  09/08/18            Plan - 08/27/18 1417    Clinical Impression Statement  Pt this week 12 wks s/p ORIF L distal radius fx - pt showed great progress in wrist strength and able to carry about 8-10 lbs this date without soft splint -and no pain or pulling - pt upgrade to 3 lbs for wrist in all planes and putty increase to firm - pt to cont with HEP for 1-2 wks and follow up     Occupational performance deficits (Please refer to evaluation for details):  ADL's;IADL's;Work;Play;Leisure    Rehab Potential  Good    OT Frequency  1x / week    OT Duration  2 weeks    OT Treatment/Interventions  Therapeutic exercise;Self-care/ADL training;Contrast Bath;Fluidtherapy;Patient/family education;Scar mobilization;Passive range of motion;Manual Therapy    Plan  assess progress and tolerance for 3 lbs and firm putty     Clinical Decision Making  Several treatment options, min-mod task modification necessary    OT Home Exercise Plan  see flowsheet    Consulted and Agree with Plan of Care  Patient       Patient will benefit from skilled therapeutic intervention in order to improve the following deficits and impairments:  Pain, Impaired flexibility, Increased edema, Decreased scar mobility, Decreased range of  motion, Decreased strength, Impaired UE functional use  Visit Diagnosis: Pain in left wrist  Stiffness of left wrist, not elsewhere classified  Muscle weakness (generalized)  Scar condition and fibrosis of skin    Problem List There are no active problems to display for this patient.   Rosalyn Gess OTR/L,CLT 08/27/2018, 2:19 PM  [place] [facility] [address] [place], [address] Phone: [phone]   Fax:  [phone]  Name: Bradley Sexton MRN: [id number] Date of Birth: [birth year]

## 2018-08-27 NOTE — Patient Instructions (Addendum)
Increase wrist all planes 3 lbs   but 12 reps   2 x day  And putty up to dark firm blue - 12 reps gripping   2 x day   in 3 days increase to 2 sets 2 x day and another 3 days  3 sets   if pain free  Cont with isometric strengthening

## 2018-09-02 ENCOUNTER — Ambulatory Visit: Payer: Managed Care, Other (non HMO) | Admitting: Occupational Therapy

## 2018-09-02 DIAGNOSIS — M25632 Stiffness of left wrist, not elsewhere classified: Secondary | ICD-10-CM

## 2018-09-02 DIAGNOSIS — M6281 Muscle weakness (generalized): Secondary | ICD-10-CM

## 2018-09-02 DIAGNOSIS — L905 Scar conditions and fibrosis of skin: Secondary | ICD-10-CM

## 2018-09-02 DIAGNOSIS — M25532 Pain in left wrist: Secondary | ICD-10-CM | POA: Diagnosis not present

## 2018-09-02 NOTE — Therapy (Signed)
[place] [facility] [address] [place], [address] Phone: [phone]   Fax:  [phone]  Occupational Therapy Treatment  Patient Details  Name: Bradley Sexton MRN: [id number] Date of Birth: Jan 15, 1971 No data recorded  Encounter Date: 09/02/2018  OT End of Session - 09/02/18 0822    Visit Number  11    Number of Visits  14    Date for OT Re-Evaluation  09/08/18    OT Start Time  0804    OT Stop Time  0848    OT Time Calculation (min)  44 min    Activity Tolerance  Patient tolerated treatment well    Behavior During Therapy  [facility] for tasks assessed/performed       Past Medical History:  Diagnosis Date  . Atrial fibrillation ([place]) 2016   X 1 WHEN GASSED AT [employer]  . Hypertension     Past Surgical History:  Procedure Laterality Date  . NO PAST SURGERIES    . OPEN REDUCTION INTERNAL FIXATION (ORIF) DISTAL RADIAL FRACTURE Left 06/03/2018   Procedure: OPEN REDUCTION INTERNAL FIXATION (ORIF) DISTAL RADIAL FRACTURE;  Surgeon: Hessie Knows, MD;  Location: [facility] ORS;  Service: Orthopedics;  Laterality: Left;    There were no vitals filed for this visit.  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  No/denies         [facility] OT Assessment - 09/02/18 0001      AROM   Left Wrist Extension  63 Degrees    Left Wrist Flexion  65 Degrees      Strength   Right Hand Grip (lbs)  114    Right Hand Lateral Pinch  22 lbs    Right Hand 3 Point Pinch  25 lbs    Left Hand Grip (lbs)  80    Left Hand Lateral Pinch  20 lbs    Left Hand 3 Point Pinch  18 lbs      pt  report some stiffness and full feeling after doing 3 lbs weight and dark blue firm putty at home          OT Treatments/Exercises (OP) - 09/02/18 0001      LUE Paraffin   Number Minutes Paraffin  9 Minutes    LUE Paraffin Location  Wrist;Forearm    Comments  wrist flexion stretch and extention stretch        CPM for wrist flexion 200 sec and 15 lbs on BTE wrist flexion 80 sec  Table slides for wrist extnetion 15 reps- pt to do at home - but pain free   gripper at 45 lbs 120 sec ,     Review  3 lbs for HEP   I wrist all planes 3 lbs   but 12 reps  Can do 3 sets at home - if thumb CMC pain or discomfort - can wear CMC neoprene splint   2 x day  And putty up to dark firm blue - 12 reps  gripping   2 x day    3 sets   ipain free  Cont with isometric strengthening         OT Education - 09/02/18 [phone]    Education Details  cont with 3 lbs weight and dark blue putty    Person(s) Educated  Patient    Methods  Explanation;Demonstration;Handout    Comprehension  Verbalized understanding;Returned demonstration       OT Short Term Goals - 08/11/18 1701      OT SHORT TERM GOAL #1   Title  Pt to be ind in HEP for scar management, ROM and strength to increase use of L hand and wean out of splint     Baseline  progressing very well  except strengthening -in thumb spica - await next xray     Time  3    Period  Weeks    Status  On-going    Target Date  09/01/18        OT Long Term Goals - 08/11/18 1701      OT LONG TERM GOAL #1   Title  L wrist AROM improve to [facility] to be able to use in more than 50% of ADL's  with no increase symptoms     Baseline  AROM improving - wrist extention and flexion still decrease mostly - but using L hand in ADL's     Time  3    Period  Weeks    Status  On-going    Target Date  09/01/18      OT LONG TERM GOAL #2   Title  Grip strength in L hand increase to more than 50% compare to R to be able to carry more than 5 lbs and cut with  knife     Baseline  Grip strength L  74 this date ,and R 114 lbs  but had still some discomfort last week with 2 lbs     Time  4    Period  Weeks    Status  On-going    Target Date  09/08/18      OT LONG TERM GOAL #3   Title  Wrist strength increase to 4+/5 to turn doorknob, push door , drive with L hand with no increase symptoms     Baseline  able to do 1 lbs - but had some pain with 2 lbs last week- await next xray     Time  4    Period  Weeks    Status  On-going    Target Date  09/08/18            Plan - 09/02/18 0459    Clinical Impression Statement  Pt is about 13 wks s/p - doing very well in pain , strength and functional use - wrist flexion AROM still decrease but with HEP and in session increase greatly - wrist extention still impaired and hard for pt to get back - pt to cont with same weight and putty -to focus on flexion of wrist and add some table slides for wrist extnetion     Occupational performance deficits (Please refer to evaluation for details):  ADL's;IADL's;Work;Play;Leisure    Rehab Potential  Good    OT Frequency  Biweekly    OT Duration  2 weeks    OT Treatment/Interventions  Therapeutic exercise;Self-care/ADL training;Contrast Bath;Fluidtherapy;Patient/family education;Scar mobilization;Passive range of motion;Manual Therapy    Plan  assess progress and tolerance for 3 lbs and firm putty  Clinical Decision Making  Several treatment options, min-mod task modification necessary    OT Home Exercise Plan  see flowsheet    Consulted and Agree with Plan of Care  Patient       Patient will benefit from skilled therapeutic intervention in order to improve the following deficits and impairments:  Pain, Impaired flexibility, Increased edema, Decreased scar mobility, Decreased range of motion, Decreased strength, Impaired UE functional use  Visit Diagnosis: Pain in left wrist  Stiffness of left wrist, not elsewhere classified  Muscle weakness  (generalized)  Scar condition and fibrosis of skin    Problem List There are no active problems to display for this patient.   Rosalyn Gess OTR/L,CLT 09/02/2018, 9:35 AM  [place] [facility] [address] [place], [address] Phone: [phone]   Fax:  [phone]  Name: Bradley Sexton MRN: [id number] Date of Birth: [birth year]

## 2018-09-02 NOTE — Patient Instructions (Signed)
Same HEP with 3 lbs weight and dark firm putty Can wear CMC neoprene splint with weight for 2 sets of causing some CMC thumb pain   Cont with ROM - focus on wrist flexion and add gentle weight bearing for wrist extention table slides

## 2018-09-16 ENCOUNTER — Ambulatory Visit: Payer: Managed Care, Other (non HMO) | Attending: Orthopedic Surgery | Admitting: Occupational Therapy

## 2018-09-16 DIAGNOSIS — L905 Scar conditions and fibrosis of skin: Secondary | ICD-10-CM | POA: Insufficient documentation

## 2018-09-16 DIAGNOSIS — M25532 Pain in left wrist: Secondary | ICD-10-CM | POA: Diagnosis present

## 2018-09-16 DIAGNOSIS — M25632 Stiffness of left wrist, not elsewhere classified: Secondary | ICD-10-CM

## 2018-09-16 DIAGNOSIS — M6281 Muscle weakness (generalized): Secondary | ICD-10-CM | POA: Insufficient documentation

## 2018-09-16 NOTE — Therapy (Signed)
[place] [facility] [address], [place], [address] Phone: [phone]   Fax:  [phone]  Occupational Therapy Treatment  Patient Details  Name: Bradley Sexton MRN: [id number] Date of Birth: 04-28-1971 No data recorded  Encounter Date: 09/16/2018  OT End of Session - 09/16/18 0823    Visit Number  12    Number of Visits  13    Date for OT Re-Evaluation  10/14/18    OT Start Time  0735    OT Stop Time  0811    OT Time Calculation (min)  36 min    Activity Tolerance  Patient tolerated treatment well    Behavior During Therapy  [facility] for tasks assessed/performed       Past Medical History:  Diagnosis Date  . Atrial fibrillation ([place]) 2016   X 1 WHEN GASSED AT [employer]  . Hypertension     Past Surgical History:  Procedure Laterality Date  . NO PAST SURGERIES    . OPEN REDUCTION INTERNAL FIXATION (ORIF) DISTAL RADIAL FRACTURE Left 06/03/2018   Procedure: OPEN REDUCTION INTERNAL FIXATION (ORIF) DISTAL RADIAL FRACTURE;  Surgeon: Hessie Knows, MD;  Location: [facility] ORS;  Service: Orthopedics;  Laterality: Left;    There were no vitals filed for this visit.  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  No/denies         [facility] OT Assessment - 09/16/18 0001      AROM   Right Forearm Supination  75 Degrees    Left Forearm Supination  75 Degrees    Left Wrist Extension  65 Degrees    Left Wrist Flexion  63 Degrees    Left Wrist Radial Deviation  25 Degrees    Left Wrist Ulnar Deviation  20  Degrees      Strength   Right Hand Grip (lbs)  120    Right Hand Lateral Pinch  22 lbs    Right Hand 3 Point Pinch  25 lbs    Left Hand Grip (lbs)  80    Left Hand Lateral Pinch  20 lbs    Left Hand 3 Point Pinch  20 lbs         Taken some measurements - see flowsheet- pt report less clicking in elbow  And only some tight feeling if over doing his putty  Wrist extention still impaired       OT Treatments/Exercises (OP) - 09/16/18 0001      LUE Paraffin   Number Minutes Paraffin  10 Minutes    LUE Paraffin Location  Wrist;Forearm    Comments  wrist flexion and extention stretch with hand in fist alternated 1 -2 min at time        Review with pt Table slides for wrist extnetion  - pt not pushing thru palm - need min A -  and can do 15 reps -20 reps   can do 10-25% weight bearing and work to increase it over the next 3 wks gradually without increase pain - pt to do at home   Done wrist extention and flexion stretches with hand in fist - by OT and pt   12 reps each  Harder end on wrist extention  Pt could not do wall pushup - or pushing up from chair     cont with  3 lbs for HEP Iwrist all planes 3 lbs  but 12 reps  Can do 3 sets at home - if thumb CMC pain or discomfort - can wear CMC neoprene splint  2 x day  And putty up to dark firm blue - 12 reps gripping  2 x day   3 sets       OT Education - 09/16/18 0823    Education Details  progress and cont with conditioning and ROM to L wrist and hand     Person(s) Educated  Patient    Methods  Explanation;Demonstration;Handout    Comprehension  Verbalized understanding;Returned demonstration       OT Short Term Goals - 09/16/18 0826      OT SHORT TERM GOAL #1   Title  Pt to be ind in HEP for scar management, ROM and strength to increase use of L hand and wean out of splint     Status  Achieved        OT Long Term Goals - 09/16/18 0826      OT LONG TERM GOAL #1   Title  L wrist AROM improve to  [facility] to be able to use in more than 50% of ADL's  with no increase symptoms     Baseline  AROM WFL and able to do most all act - except wrist extention and weight bearing or push up thru palm     Time  4    Period  Weeks    Status  On-going    Target Date  10/14/18      OT LONG TERM GOAL #2   Title  Grip strength in L hand increase to more than 50% compare to R to be able to carry more than 5 lbs and cut with knife     Status  Achieved      OT LONG TERM GOAL #3   Title  Wrist strength increase to 4+/5 to turn doorknob, push door , drive with L hand with no increase symptoms     Status  Achieved            Plan - 09/16/18 0824    Clinical Impression Statement  Pt is about 15 wks s/p - doing very well with pain , grip and wrist strength - and increase use of L hand and wrist - but still limited with wrist extention and weight bearing thru palm - pt to work on table slides with 10-25% weight thru palm , cont to condition L hand , wrist and arm for 3-4 wks - follow up with surgeon in 2 wks and then with me in 3-4 wks for discharge     Occupational performance deficits (Please refer to evaluation for details):  ADL's;IADL's;Work;Play;Leisure    Rehab Potential  Good    OT Frequency  Monthly    OT Duration  4 weeks    OT Treatment/Interventions  Therapeutic exercise;Self-care/ADL training;Contrast Bath;Fluidtherapy;Patient/family education;Scar mobilization;Passive range of motion;Manual Therapy    Plan  assess progress  and tolerance for 3 lbs and firm putty - weight bearing - discharge probably    Clinical Decision Making  Limited treatment options, no task modification necessary    OT Home Exercise Plan  see flowsheet    Consulted and Agree with Plan of Care  Patient       Patient will benefit from skilled therapeutic intervention in order to improve the following deficits and impairments:  Pain, Impaired flexibility, Increased edema, Decreased scar mobility, Decreased range of motion,  Decreased strength, Impaired UE functional use  Visit Diagnosis: Pain in left wrist - Plan: Ot plan of care cert/re-cert  Stiffness of left wrist, not elsewhere classified - Plan: Ot plan of care cert/re-cert  Muscle weakness (generalized) - Plan: Ot plan of care cert/re-cert  Scar condition and fibrosis of skin - Plan: Ot plan of care cert/re-cert    Problem List There are no active problems to display for this patient.   Rosalyn Gess OTR/L,CLT 09/16/2018, 8:30 AM  [place] [facility] [address], [place], [address] Phone: [phone]   Fax:  [phone]  Name: AMUN STEMM MRN: [id number] Date of Birth: July 20, 1971

## 2018-09-16 NOTE — Patient Instructions (Signed)
Cont with same HEP for putty and wrist  Work on table slides for wrist extention - with 10-25 % weight thru palm

## 2018-10-07 ENCOUNTER — Ambulatory Visit: Payer: Managed Care, Other (non HMO) | Admitting: Occupational Therapy

## 2019-06-19 ENCOUNTER — Emergency Department: Payer: Managed Care, Other (non HMO)

## 2019-06-19 ENCOUNTER — Inpatient Hospital Stay
Admission: EM | Admit: 2019-06-19 | Discharge: 2019-06-21 | DRG: 309 | Disposition: A | Discharge status: Home / Self Care | Payer: Managed Care, Other (non HMO) | Attending: Internal Medicine | Admitting: Internal Medicine

## 2019-06-19 ENCOUNTER — Other Ambulatory Visit: Payer: Self-pay

## 2019-06-19 DIAGNOSIS — Z79899 Other long term (current) drug therapy: Secondary | ICD-10-CM

## 2019-06-19 DIAGNOSIS — I4891 Unspecified atrial fibrillation: Secondary | ICD-10-CM | POA: Diagnosis present

## 2019-06-19 DIAGNOSIS — Z8349 Family history of other endocrine, nutritional and metabolic diseases: Secondary | ICD-10-CM

## 2019-06-19 DIAGNOSIS — Z20828 Contact with and (suspected) exposure to other viral communicable diseases: Secondary | ICD-10-CM | POA: Diagnosis present

## 2019-06-19 DIAGNOSIS — R55 Syncope and collapse: Secondary | ICD-10-CM | POA: Diagnosis present

## 2019-06-19 DIAGNOSIS — Z8249 Family history of ischemic heart disease and other diseases of the circulatory system: Secondary | ICD-10-CM

## 2019-06-19 DIAGNOSIS — I480 Paroxysmal atrial fibrillation: Principal | ICD-10-CM | POA: Diagnosis present

## 2019-06-19 DIAGNOSIS — I10 Essential (primary) hypertension: Secondary | ICD-10-CM | POA: Diagnosis present

## 2019-06-19 DIAGNOSIS — Z6841 Body Mass Index (BMI) 40.0 and over, adult: Secondary | ICD-10-CM

## 2019-06-19 DIAGNOSIS — Z823 Family history of stroke: Secondary | ICD-10-CM

## 2019-06-19 LAB — TROPONIN I (HIGH SENSITIVITY)
Troponin I (High Sensitivity): 9 ng/L (ref <18)
Troponin I (High Sensitivity): 8 ng/L (ref <18)

## 2019-06-19 LAB — PHOSPHORUS: Phosphorus: 3.3 mg/dL (ref 2.5–4.6)

## 2019-06-19 LAB — CBC WITH DIFFERENTIAL/PLATELET
Abs Immature Granulocytes: 0.02 10*3/uL (ref 0.00–0.07)
Basophils Absolute: 0.1 10*3/uL (ref 0.0–0.1)
Basophils Relative: 1 %
Eosinophils Absolute: 0.1 10*3/uL (ref 0.0–0.5)
Eosinophils Relative: 1 %
HCT: 46.1 % (ref 39.0–52.0)
Hemoglobin: 15.7 g/dL (ref 13.0–17.0)
Immature Granulocytes: 0 %
Lymphocytes Relative: 29 %
Lymphs Abs: 2.3 10*3/uL (ref 0.7–4.0)
MCH: 31.3 pg (ref 26.0–34.0)
MCHC: 34.1 g/dL (ref 30.0–36.0)
MCV: 92 fL (ref 80.0–100.0)
Monocytes Absolute: 0.6 10*3/uL (ref 0.1–1.0)
Monocytes Relative: 7 %
Neutro Abs: 4.8 10*3/uL (ref 1.7–7.7)
Neutrophils Relative %: 62 %
Platelets: 236 10*3/uL (ref 150–400)
RBC: 5.01 MIL/uL (ref 4.22–5.81)
RDW: 12.5 % (ref 11.5–15.5)
WBC: 7.8 10*3/uL (ref 4.0–10.5)
nRBC: 0 % (ref 0.0–0.2)

## 2019-06-19 LAB — COMPREHENSIVE METABOLIC PANEL
ALT: 30 U/L (ref 0–44)
AST: 28 U/L (ref 15–41)
Albumin: 4.1 g/dL (ref 3.5–5.0)
Alkaline Phosphatase: 74 U/L (ref 38–126)
Anion gap: 11 (ref 5–15)
BUN: 16 mg/dL (ref 6–20)
CO2: 22 mmol/L (ref 22–32)
Calcium: 9.5 mg/dL (ref 8.9–10.3)
Chloride: 106 mmol/L (ref 98–111)
Creatinine, Ser: 0.85 mg/dL (ref 0.61–1.24)
GFR calc Af Amer: >60 mL/min (ref >60)
GFR calc non Af Amer: >60 mL/min (ref >60)
Glucose, Bld: 87 mg/dL (ref 70–99)
Potassium: 4.2 mmol/L (ref 3.5–5.1)
Sodium: 139 mmol/L (ref 135–145)
Total Bilirubin: 1.3 mg/dL — ABNORMAL HIGH (ref 0.3–1.2)
Total Protein: 7.4 g/dL (ref 6.5–8.1)

## 2019-06-19 LAB — URINALYSIS, COMPLETE (UACMP) WITH MICROSCOPIC
Bacteria, UA: NONE SEEN
Bilirubin Urine: NEGATIVE
Glucose, UA: NEGATIVE mg/dL
Hgb urine dipstick: NEGATIVE
Ketones, ur: NEGATIVE mg/dL
Leukocytes,Ua: NEGATIVE
Nitrite: NEGATIVE
Protein, ur: NEGATIVE mg/dL
Specific Gravity, Urine: 1.011 (ref 1.005–1.030)
Squamous Epithelial / HPF: NONE SEEN (ref 0–5)
pH: 6 (ref 5.0–8.0)

## 2019-06-19 LAB — PROTIME-INR
INR: 1.1 (ref 0.8–1.2)
Prothrombin Time: 14.1 seconds (ref 11.4–15.2)

## 2019-06-19 LAB — T4, FREE: Free T4: 0.94 ng/dL (ref 0.61–1.12)

## 2019-06-19 LAB — TSH: TSH: 3.44 u[IU]/mL (ref 0.350–4.500)

## 2019-06-19 LAB — MAGNESIUM: Magnesium: 1.9 mg/dL (ref 1.7–2.4)

## 2019-06-19 LAB — APTT: aPTT: 51 seconds — ABNORMAL HIGH (ref 24–36)

## 2019-06-19 MED ORDER — ONDANSETRON HCL 4 MG/2ML IJ SOLN: 4.0000 mg | Freq: Four times a day (QID) | INTRAMUSCULAR | Status: DC | PRN

## 2019-06-19 MED ORDER — ACETAMINOPHEN 325 MG PO TABS: 650.0000 mg | ORAL_TABLET | Freq: Four times a day (QID) | ORAL | Status: DC | PRN

## 2019-06-19 MED ORDER — SODIUM CHLORIDE 0.9 % IV BOLUS
1000.0000 mL | Freq: Once | INTRAVENOUS | Status: AC
  Administered 2019-06-19: 17:00:00 1000 mL via INTRAVENOUS

## 2019-06-19 MED ORDER — ONDANSETRON HCL 4 MG PO TABS: 4.0000 mg | ORAL_TABLET | Freq: Four times a day (QID) | ORAL | Status: DC | PRN

## 2019-06-19 MED ORDER — LISINOPRIL 10 MG PO TABS
10.0000 mg | ORAL_TABLET | Freq: Every day | ORAL | Status: DC
  Administered 2019-06-20 – 2019-06-21 (×2): 10 mg via ORAL
  Filled 2019-06-19 (×2): qty 1

## 2019-06-19 MED ORDER — DILTIAZEM HCL 100 MG IV SOLR
5.0000 mg/h | INTRAVENOUS | Status: DC
  Administered 2019-06-19 (×2): 5 mg/h via INTRAVENOUS
  Filled 2019-06-19 (×2): qty 100

## 2019-06-19 MED ORDER — SODIUM CHLORIDE 0.9% FLUSH
3.0000 mL | Freq: Two times a day (BID) | INTRAVENOUS | Status: DC
  Administered 2019-06-20 – 2019-06-21 (×3): 3 mL via INTRAVENOUS

## 2019-06-19 MED ORDER — METOPROLOL TARTRATE 50 MG PO TABS
50.0000 mg | ORAL_TABLET | Freq: Two times a day (BID) | ORAL | Status: DC
  Administered 2019-06-19 – 2019-06-21 (×4): 50 mg via ORAL
  Filled 2019-06-19 (×4): qty 1

## 2019-06-19 MED ORDER — ACETAMINOPHEN 650 MG RE SUPP: 650.0000 mg | Freq: Four times a day (QID) | RECTAL | Status: DC | PRN

## 2019-06-19 MED ORDER — HEPARIN BOLUS VIA INFUSION
6700.0000 [IU] | Freq: Once | INTRAVENOUS | Status: AC
  Administered 2019-06-19: 18:00:00 6700 [IU] via INTRAVENOUS
  Filled 2019-06-19: qty 6700

## 2019-06-19 MED ORDER — DILTIAZEM LOAD VIA INFUSION
30.0000 mg | Freq: Once | INTRAVENOUS | Status: AC
  Administered 2019-06-19: 17:00:00 30 mg via INTRAVENOUS
  Filled 2019-06-19: qty 30

## 2019-06-19 MED ORDER — HEPARIN (PORCINE) 25000 UT/250ML-% IV SOLN
1700.0000 [IU]/h | INTRAVENOUS | Status: DC
  Administered 2019-06-19 – 2019-06-20 (×2): 1700 [IU]/h via INTRAVENOUS
  Filled 2019-06-19 (×2): qty 250

## 2019-06-19 MED ORDER — POLYETHYLENE GLYCOL 3350 17 G PO PACK: 17.0000 g | PACK | Freq: Every day | ORAL | Status: DC | PRN

## 2019-06-19 NOTE — ED Triage Notes (Signed)
Pt arrived via POV with reports of syncopal episode on Friday and feeling like his heart is out of rhythm.  Pt reports hx of afib but in his 69s.    Pt c/o recently feeling lightheaded. Reports chest discomfort with irregular rhythm.

## 2019-06-19 NOTE — Progress Notes (Signed)
ANTICOAGULATION CONSULT NOTE - Initial Consult  Pharmacy Consult for Heparin drip Indication: atrial fibrillation  No Known Allergies  Patient Measurements: Height: 6' (182.9 cm) Weight: (!) 330 lb (149.7 kg) IBW/kg (Calculated) : 77.6 Heparin Dosing Weight: 112.8 kg  Vital Signs: Temp: 98.2 F (36.8 C) (10/04 1607) Temp Source: Oral (10/04 1607) BP: 123/82 (10/04 1745) Pulse Rate: 76 (10/04 1745)  Labs: Recent Labs    06/19/19 1619  HGB 15.7  HCT 46.1  PLT 236  CREATININE 0.85  TROPONINIHS 9    Estimated Creatinine Clearance: 163.4 mL/min (by C-G formula based on SCr of 0.85 mg/dL).   Medical History: Past Medical History:  Diagnosis Date  . Atrial fibrillation ([place]) 2016   X 1 WHEN GASSED AT [employer]  . Hypertension    Assessment: Patient is a 48yo male admitted with afib. Pharmacy consulted for Heparin dosing.  Goal of Therapy:  Heparin level 0.3-0.7 units/ml Monitor platelets by anticoagulation protocol: Yes   Plan:  Give 6700 units bolus x 1 Start heparin infusion at 1700 units/hr Check anti-Xa level in 6 hours and daily while on heparin Continue to monitor H&H and platelets  Paulina Fusi, PharmD, BCPS 06/19/2019 5:58 PM

## 2019-06-19 NOTE — H&P (Addendum)
[facility] NAME: Bradley Sexton    MR#:  [id number]  DATE OF BIRTH:  Dec 26, 1970  DATE OF ADMISSION:  06/19/2019  PRIMARY CARE PHYSICIAN: Kirk Ruths, MD   REQUESTING/REFERRING PHYSICIAN: Dr. Joni Fears  CHIEF COMPLAINT:   Chief Complaint  Patient presents with  . Loss of Consciousness  . Irregular Heart Beat    HISTORY OF PRESENT ILLNESS:  Bradley Sexton  is a 48 y.o. male with a known history of hypertension, atrial fibrillation many years back presents to the emergency room complaining of palpitations, syncopal episode on Friday.  No chest pain or shortness of breath.  Feels extremely fatigued.  No recent change in medications.  Afebrile.  Here in the emergency room patient was found to have atrial fibrillation with rapid ventricular rate.  Was given Cardizem bolus and then had to start on a Cardizem drip after which heart rate has improved.  Patient takes lisinopril for his hypertension.  No thyroid problems.  Works in [employer]. He has had intermittent palpitations over the years which last only a few seconds.   PAST MEDICAL HISTORY:   Past Medical History:  Diagnosis Date  . Atrial fibrillation ([place]) 2016   X 1 WHEN GASSED AT [employer]  . Hypertension     PAST SURGICAL HISTORY:   Past Surgical History:  Procedure Laterality Date  . NO PAST SURGERIES    . OPEN REDUCTION INTERNAL FIXATION (ORIF) DISTAL RADIAL FRACTURE Left 06/03/2018   Procedure: OPEN REDUCTION INTERNAL FIXATION (ORIF) DISTAL RADIAL FRACTURE;  Surgeon: Hessie Knows, MD;  Location: [facility] ORS;  Service: Orthopedics;  Laterality: Left;    SOCIAL HISTORY:   Social History   Tobacco Use  . Smoking status: Never Smoker  . Smokeless tobacco: Never Used  Substance Use Topics  . Alcohol use: Yes    Alcohol/week: 0.0 standard drinks    Comment: occasionally    FAMILY HISTORY:   Family History  Problem Relation Age of Onset  . Heart  disease Mother   . Stroke Mother   . Hypertension Mother   . Hyperlipidemia Mother   . Diabetes Mother   . Heart disease Paternal Grandfather     DRUG ALLERGIES:  No Known Allergies  REVIEW OF SYSTEMS:   Review of Systems  Constitutional: Positive for malaise/fatigue. Negative for chills, fever and weight loss.  HENT: Negative for hearing loss and nosebleeds.   Eyes: Negative for blurred vision, double vision and pain.  Respiratory: Negative for cough, hemoptysis, sputum production, shortness of breath and wheezing.   Cardiovascular: Positive for palpitations. Negative for chest pain, orthopnea and leg swelling.  Gastrointestinal: Negative for abdominal pain, constipation, diarrhea, nausea and vomiting.  Genitourinary: Negative for dysuria and hematuria.  Musculoskeletal: Negative for back pain, falls and myalgias.  Skin: Negative for rash.  Neurological: Positive for loss of consciousness. Negative for dizziness, tremors, sensory change, speech change, focal weakness, seizures and headaches.  Endo/Heme/Allergies: Does not bruise/bleed easily.  Psychiatric/Behavioral: Negative for depression and memory loss. The patient is not nervous/anxious.     MEDICATIONS AT HOME:   Prior to Admission medications   Medication Sig Start Date End Date Taking? Authorizing Provider  Flaxseed, Linseed, (FLAXSEED OIL PO) Take 3 capsules by mouth daily. 1300 mg per cap    [provider]  HYDROcodone-acetaminophen (NORCO) 5-325 MG tablet Take 1 tablet by mouth every 6 (six) hours as needed for moderate pain. Patient not taking: Reported on 06/19/2019 06/03/18  Hessie Knows, MD  lisinopril (PRINIVIL,ZESTRIL) 20 MG tablet Take 1 tablet (20 mg total) by mouth daily. Patient taking differently: Take 20 mg by mouth every morning.  01/21/17   Fisher, Linden Dolin, PA-C  meloxicam (MOBIC) 15 MG tablet Take 1 tablet (15 mg total) by mouth daily. Patient not taking: Reported on 06/14/2018 05/30/18    Cuthriell, Charline Bills, PA-C  Multiple Vitamins-Minerals (MULTIVITAMIN WITH MINERALS) tablet Take 1 tablet by mouth daily.    [provider]     VITAL SIGNS:  Blood pressure 123/82, pulse 76, temperature 98.2 F (36.8 C), temperature source Oral, resp. rate (!) 22, height 6' (1.829 m), weight (!) 149.7 kg, SpO2 98 %.  PHYSICAL EXAMINATION:  Physical Exam  GENERAL:  48 y.o.-year-old patient lying in the bed with no acute distress.  Obese EYES: Pupils equal, round, reactive to light and accommodation. No scleral icterus. Extraocular muscles intact.  HEENT: Head atraumatic, normocephalic. Oropharynx and nasopharynx clear. No oropharyngeal erythema, moist oral mucosa  NECK:  Supple, no jugular venous distention. No thyroid enlargement, no tenderness.  LUNGS: Normal breath sounds bilaterally, no wheezing, rales, rhonchi. No use of accessory muscles of respiration.  CARDIOVASCULAR: Irregularly irregular ABDOMEN: Soft, nontender, nondistended. Bowel sounds present. No organomegaly or mass.  EXTREMITIES: No pedal edema, cyanosis, or clubbing. + 2 pedal & radial pulses b/l.   NEUROLOGIC: Cranial nerves II through XII are intact. No focal Motor or sensory deficits appreciated b/l PSYCHIATRIC: The patient is alert and oriented x 3. Good affect.  SKIN: No obvious rash, lesion, or ulcer.   LABORATORY PANEL:   CBC Recent Labs  Lab 06/19/19 1619  WBC 7.8  HGB 15.7  HCT 46.1  PLT 236   ------------------------------------------------------------------------------------------------------------------  Chemistries  Recent Labs  Lab 06/19/19 1619 06/19/19 1624  NA 139  --   K 4.2  --   CL 106  --   CO2 22  --   GLUCOSE 87  --   BUN 16  --   CREATININE 0.85  --   CALCIUM 9.5  --   MG  --  1.9  AST 28  --   ALT 30  --   ALKPHOS 74  --   BILITOT 1.3*  --     ------------------------------------------------------------------------------------------------------------------  Cardiac Enzymes No results for input(s): TROPONINI in the last 168 hours. ------------------------------------------------------------------------------------------------------------------  RADIOLOGY:  Dg Chest Portable 1 View  Result Date: 06/19/2019 CLINICAL DATA:  Tachycardia EXAM: PORTABLE CHEST 1 VIEW COMPARISON:  None. FINDINGS: Top-normal heart size. Normal mediastinal contour. No pneumothorax. No pleural effusion. Lungs appear clear, with no acute consolidative airspace disease and no pulmonary edema. IMPRESSION: No active disease. Electronically Signed   By: Ilona Sorrel M.D.   On: 06/19/2019 16:51     IMPRESSION AND PLAN:   *Paroxysmal atrial fibrillation.  Patient has paroxysmal A. fib few years back. Has had intermittent episodes .  Will start oral metoprolol.  Stop Cardizem drip if heart rate remains controlled.  Start heparin drip.  Check echocardiogram.  TSH sent and pending.  Consult cardiology.  Sent message to Dr. Clayborn Bigness.  Observation admission to telemetry unit. Can transition to Eliquis tomorrow if he does not need any procedures.  * Syncope - Likely secondary to Afib On telemetry monitor Check electrolytes  *Hypertension.  Continue lisinopril.  Added metoprolol.  *DVT prophylaxis.  Started on heparin drip.  All the records are reviewed and case discussed with ED provider. Management plans discussed with the patient, family and they are  in agreement.  CODE STATUS: Full code  TOTAL TIME TAKING CARE OF THIS PATIENT: 40 minutes.   Leia Alf Glena Pharris M.D on 06/19/2019 at 5:47 PM  Between 7am to 6pm - Pager - [phone]  After 6pm go to [email or web address] - password [email or web address]  Office  [phone]  CC: Primary care physician; Kirk Ruths, MD  Note: This dictation was prepared with Dragon dictation along  with smaller phrase technology. Any transcriptional errors that result from this process are unintentional.

## 2019-06-19 NOTE — ED Notes (Signed)
ED TO INPATIENT HANDOFF REPORT  ED Nurse Name and Phone #: Selinda Eon [phone]  S Name/Age/Gender Bradley Sexton 48 y.o. male Room/Bed: ED12A/ED12A  Code Status   Code Status: Full Code  Home/SNF/Other Home Patient oriented to: self, place, time and situation Is this baseline? Yes   Triage Complete: Triage complete  Chief Complaint Irregular Heart Beat  Triage Note Pt arrived via [place] with reports of syncopal episode on Friday and feeling like his heart is out of rhythm.  Pt reports hx of afib but in his 64s.    Pt c/o recently feeling lightheaded. Reports chest discomfort with irregular rhythm.   Allergies No Known Allergies  Level of Care/Admitting Diagnosis ED Disposition    ED Disposition Condition [facility] Area: [place] [100120]  Level of Care: Telemetry [5]  Covid Evaluation: Asymptomatic Screening Protocol (No Symptoms)  Diagnosis: A-fib [facility]) [468032]  Admitting Physician: Hillary Bow [[id number]]  Attending Physician: Hillary Bow [[id number]]  PT Class (Do Not Modify): Observation [104]  PT Acc Code (Do Not Modify): Observation [10022]       B Medical/Surgery History Past Medical History:  Diagnosis Date  . Atrial fibrillation ([place]) 2016   X 1 WHEN GASSED AT [employer]  . Hypertension    Past Surgical History:  Procedure Laterality Date  . NO PAST SURGERIES    . OPEN REDUCTION INTERNAL FIXATION (ORIF) DISTAL RADIAL FRACTURE Left 06/03/2018   Procedure: OPEN REDUCTION INTERNAL FIXATION (ORIF) DISTAL RADIAL FRACTURE;  Surgeon: Hessie Knows, MD;  Location: [facility] ORS;  Service: Orthopedics;  Laterality: Left;     A IV Location/Drains/Wounds Patient Lines/Drains/Airways Status   Active Line/Drains/Airways    Name:   Placement date:   Placement time:   Site:   Days:   Peripheral IV 06/19/19 Left Antecubital   06/19/19    1619    Antecubital   less than 1   Peripheral IV 06/19/19 Left Hand   06/19/19    1813     Hand   less than 1   Airway   06/03/18    1030     381   Incision (Closed) 06/03/18 Wrist Left   06/03/18    1033     381          Intake/Output Last 24 hours No intake or output data in the 24 hours ending 06/19/19 1928  Labs/Imaging Results for orders placed or performed during the hospital encounter of 06/19/19 (from the past 48 hour(s))  Comprehensive metabolic panel     Status: Abnormal   Collection Time: 06/19/19  4:19 PM  Result Value Ref Range   Sodium 139 135 - 145 mmol/L   Potassium 4.2 3.5 - 5.1 mmol/L   Chloride 106 98 - 111 mmol/L   CO2 22 22 - 32 mmol/L   Glucose, Bld 87 70 - 99 mg/dL   BUN 16 6 - 20 mg/dL   Creatinine, Ser 0.85 0.61 - 1.24 mg/dL   Calcium 9.5 8.9 - 10.3 mg/dL   Total Protein 7.4 6.5 - 8.1 g/dL   Albumin 4.1 3.5 - 5.0 g/dL   AST 28 15 - 41 U/L   ALT 30 0 - 44 U/L   Alkaline Phosphatase 74 38 - 126 U/L   Total Bilirubin 1.3 (H) 0.3 - 1.2 mg/dL   GFR calc non Af Amer >60 >60 mL/min   GFR calc Af Amer >60 >60 mL/min   Anion gap 11 5 - 15  Comment: Performed at [facility], [place], [place] [address]  Troponin I (High Sensitivity)     Status: None   Collection Time: 06/19/19  4:19 PM  Result Value Ref Range   Troponin I (High Sensitivity) 9 <18 ng/L    Comment: (NOTE) Elevated high sensitivity troponin I (hsTnI) values and significant  changes across serial measurements may suggest ACS but many other  chronic and acute conditions are known to elevate hsTnI results.  Refer to the "Links" section for chest pain algorithms and additional  guidance. Performed at [facility], [address] [place], [place] [address]   CBC with Differential     Status: None   Collection Time: 06/19/19  4:19 PM  Result Value Ref Range   WBC 7.8 4.0 - 10.5 K/uL   RBC 5.01 4.22 - 5.81 MIL/uL   Hemoglobin 15.7 13.0 - 17.0 g/dL   HCT 46.1 39.0 - 52.0 %   MCV 92.0 80.0 - 100.0 fL   MCH 31.3 26.0 - 34.0 pg   MCHC 34.1 30.0  - 36.0 g/dL   RDW 12.5 11.5 - 15.5 %   Platelets 236 150 - 400 K/uL   nRBC 0.0 0.0 - 0.2 %   Neutrophils Relative % 62 %   Neutro Abs 4.8 1.7 - 7.7 K/uL   Lymphocytes Relative 29 %   Lymphs Abs 2.3 0.7 - 4.0 K/uL   Monocytes Relative 7 %   Monocytes Absolute 0.6 0.1 - 1.0 K/uL   Eosinophils Relative 1 %   Eosinophils Absolute 0.1 0.0 - 0.5 K/uL   Basophils Relative 1 %   Basophils Absolute 0.1 0.0 - 0.1 K/uL   Immature Granulocytes 0 %   Abs Immature Granulocytes 0.02 0.00 - 0.07 K/uL    Comment: Performed at [facility], [address] [place], [place] [address]  Magnesium     Status: None   Collection Time: 06/19/19  4:24 PM  Result Value Ref Range   Magnesium 1.9 1.7 - 2.4 mg/dL    Comment: Performed at [facility], [address] [place], [place] [address]  Phosphorus     Status: None   Collection Time: 06/19/19  4:24 PM  Result Value Ref Range   Phosphorus 3.3 2.5 - 4.6 mg/dL    Comment: Performed at [facility], [address] [place], [place] [address]  TSH     Status: None   Collection Time: 06/19/19  4:24 PM  Result Value Ref Range   TSH 3.440 0.350 - 4.500 uIU/mL    Comment: Performed by a 3rd Generation assay with a functional sensitivity of <=0.01 uIU/mL. Performed at [facility], [address] [place], [place] [address]   T4, free     Status: None   Collection Time: 06/19/19  4:24 PM  Result Value Ref Range   Free T4 0.94 0.61 - 1.12 ng/dL    Comment: (NOTE) Biotin ingestion may interfere with free T4 tests. If the results are inconsistent with the TSH level, previous test results, or the clinical presentation, then consider biotin interference. If needed, order repeat testing after stopping biotin. Performed at [facility], [address] [place], [place] [address]   Urinalysis, Complete w Microscopic     Status: Abnormal   Collection Time: 06/19/19  5:45 PM  Result Value Ref Range   Color,  Urine YELLOW (A) YELLOW   APPearance CLEAR (A) CLEAR   Specific Gravity, Urine 1.011 1.005 -  1.030   pH 6.0 5.0 - 8.0   Glucose, UA NEGATIVE NEGATIVE mg/dL   Hgb urine dipstick NEGATIVE NEGATIVE   Bilirubin Urine NEGATIVE NEGATIVE   Ketones, ur NEGATIVE NEGATIVE mg/dL   Protein, ur NEGATIVE NEGATIVE mg/dL   Nitrite NEGATIVE NEGATIVE   Leukocytes,Ua NEGATIVE NEGATIVE   WBC, UA 0-5 0 - 5 WBC/hpf   Bacteria, UA NONE SEEN NONE SEEN   Squamous Epithelial / LPF NONE SEEN 0 - 5    Comment: Performed at [facility], [address], [place], [place] [address]   Dg Chest Portable 1 View  Result Date: 06/19/2019 CLINICAL DATA:  Tachycardia EXAM: PORTABLE CHEST 1 VIEW COMPARISON:  None. FINDINGS: Top-normal heart size. Normal mediastinal contour. No pneumothorax. No pleural effusion. Lungs appear clear, with no acute consolidative airspace disease and no pulmonary edema. IMPRESSION: No active disease. Electronically Signed   By: Ilona Sorrel M.D.   On: 06/19/2019 16:51    Pending Labs Unresulted Labs (From admission, onward)    Start     Ordered   06/20/19 7591  Basic metabolic panel  Tomorrow morning,   STAT     06/19/19 1743   06/20/19 0500  CBC  Tomorrow morning,   STAT     06/19/19 1743   06/20/19 0030  Heparin level (unfractionated)  Once-Timed,   STAT     06/19/19 1830   06/19/19 1754  Protime-INR  Add-on,   AD     06/19/19 1753   06/19/19 1754  APTT  Add-on,   AD     06/19/19 1753   06/19/19 1742  HIV Antibody (routine testing w rflx)  (HIV Antibody (Routine testing w reflex) panel)  Add-on,   AD     06/19/19 1743   06/19/19 1742  HIV4GL Save Tube  (HIV Antibody (Routine testing w reflex) panel)  Add-on,   AD     06/19/19 1743   06/19/19 1621  SARS CORONAVIRUS 2 (TAT 6-24 HRS) Nasopharyngeal Nasopharyngeal Swab  (Asymptomatic/Tier 2 Patients Labs)  Once,   STAT    Question Answer Comment  Is this test for diagnosis or screening Screening   Symptomatic for COVID-19 as  defined by CDC No   Hospitalized for COVID-19 No   Admitted to ICU for COVID-19 No   Previously tested for COVID-19 No   Resident in a congregate (group) care setting No   Employed in healthcare setting No      06/19/19 1620          Vitals/Pain Today's Vitals   06/19/19 1822 06/19/19 1830 06/19/19 1845 06/19/19 1915  BP: 115/74 126/71 117/83 115/60  Pulse: 74 (!) 55 77 82  Resp:  13 20 20   Temp:      TempSrc:      SpO2:  97% 97% 95%  Weight:      Height:      PainSc:        Isolation Precautions No active isolations  Medications Medications  diltiazem (CARDIZEM) 1 mg/mL load via infusion 30 mg (30 mg Intravenous Bolus from Bag 06/19/19 1640)    And  diltiazem (CARDIZEM) 100 mg in dextrose 5 % 100 mL (1 mg/mL) infusion (5 mg/hr Intravenous New Bag/Given 06/19/19 1633)  metoprolol tartrate (LOPRESSOR) tablet 50 mg (50 mg Oral Given 06/19/19 1822)  sodium chloride flush (NS) 0.9 % injection 3 mL (has no administration in time range)  acetaminophen (TYLENOL) tablet 650 mg (has no administration in time range)    Or  acetaminophen (TYLENOL) suppository 650 mg (has no administration in time range)  polyethylene glycol (MIRALAX / GLYCOLAX) packet 17 g (has no administration in time range)  ondansetron (ZOFRAN) tablet 4 mg (has no administration in time range)    Or  ondansetron (ZOFRAN) injection 4 mg (has no administration in time range)  heparin ADULT infusion 100 units/mL (25000 units/218m sodium chloride 0.45%) (1,700 Units/hr Intravenous New Bag/Given 06/19/19 1820)  lisinopril (ZESTRIL) tablet 10 mg (has no administration in time range)  sodium chloride 0.9 % bolus 1,000 mL (1,000 mLs Intravenous New Bag/Given 06/19/19 1638)  heparin bolus via infusion 6,700 Units (6,700 Units Intravenous Bolus from Bag 06/19/19 1819)    Mobility walks Low fall risk   Focused Assessments Cardiac Assessment Handoff:  Cardiac Rhythm: Atrial fibrillation No results found for:  CKTOTAL, CKMB, CKMBINDEX, TROPONINI No results found for: DDIMER Does the Patient currently have chest pain? No      R Recommendations: See Admitting Provider Note  Report given to:   Additional Notes:

## 2019-06-19 NOTE — ED Provider Notes (Signed)
[facility] Emergency Department Provider Note  ____________________________________________  Time seen: Approximately 4:33 PM  I have reviewed the triage vital signs and the nursing notes.   HISTORY  Chief Complaint Loss of Consciousness and Irregular Heart Beat    HPI Bradley Sexton is a 48 y.o. male that presents to the emergency department for evaluation of palpitations for 3 days.  Patient states that he was dancing on Friday, started to feel dizzy and lightheaded and passed out.  His friend estimated that he was passed out for 1 minute.  He is continued to feel fluttering in his chest since Friday.  This happened previously when he was in his 21s.  No chest pain.   Past Medical History:  Diagnosis Date  . Atrial fibrillation ([place]) 2016   X 1 WHEN GASSED AT [employer]  . Hypertension     There are no active problems to display for this patient.   Past Surgical History:  Procedure Laterality Date  . NO PAST SURGERIES    . OPEN REDUCTION INTERNAL FIXATION (ORIF) DISTAL RADIAL FRACTURE Left 06/03/2018   Procedure: OPEN REDUCTION INTERNAL FIXATION (ORIF) DISTAL RADIAL FRACTURE;  Surgeon: Hessie Knows, MD;  Location: [facility] ORS;  Service: Orthopedics;  Laterality: Left;    Prior to Admission medications   Medication Sig Start Date End Date Taking? Authorizing Provider  Flaxseed, Linseed, (FLAXSEED OIL PO) Take 3 capsules by mouth daily. 1300 mg per cap   Yes [provider]  lisinopril (PRINIVIL,ZESTRIL) 20 MG tablet Take 1 tablet (20 mg total) by mouth daily. Patient taking differently: Take 20 mg by mouth every morning.  01/21/17  Yes Fisher, Linden Dolin, PA-C  Multiple Vitamins-Minerals (MULTIVITAMIN WITH MINERALS) tablet Take 1 tablet by mouth daily.   Yes [provider]    Allergies Patient has no known allergies.  Family History  Problem Relation Age of Onset  . Heart disease Mother   . Stroke Mother   . Hypertension Mother    . Hyperlipidemia Mother   . Diabetes Mother   . Heart disease Paternal Grandfather     Social History Social History   Tobacco Use  . Smoking status: Never Smoker  . Smokeless tobacco: Never Used  Substance Use Topics  . Alcohol use: Yes    Alcohol/week: 0.0 standard drinks    Comment: occasionally  . Drug use: No     Review of Systems  Constitutional: No fever/chills Cardiovascular: No chest pain. Positive for palpitations. Respiratory: No SOB. Gastrointestinal: No abdominal pain.  No nausea, no vomiting.  Musculoskeletal: Negative for musculoskeletal pain. Skin: Negative for rash, abrasions, lacerations, ecchymosis. Neurological: Negative for headaches   ____________________________________________   PHYSICAL EXAM:  VITAL SIGNS: ED Triage Vitals [06/19/19 1553]  Enc Vitals Group     BP 119/88     Pulse Rate 85     Resp 18     Temp 98.4 F (36.9 C)     Temp Source Oral     SpO2 98 %     Weight (!) 330 lb (149.7 kg)     Height 6' (1.829 m)     Head Circumference      Peak Flow      Pain Score 1     Pain Loc      Pain Edu?      Excl. in [place]?      Constitutional: Alert and oriented. Well appearing and in no acute distress. Eyes: Conjunctivae are normal. PERRL. EOMI. Head:  Atraumatic. ENT:      Ears:      Nose: No congestion/rhinnorhea.      Mouth/Throat: Mucous membranes are moist.  Neck: No stridor.  Cardiovascular: Tachycardic rate, regular rhythm.  Good peripheral circulation. Respiratory: Normal respiratory effort without tachypnea or retractions. Lungs CTAB. Good air entry to the bases with no decreased or absent breath sounds. Musculoskeletal: Full range of motion to all extremities. No gross deformities appreciated. Neurologic:  Normal speech and language. No gross focal neurologic deficits are appreciated.  Skin:  Skin is warm, dry and intact. No rash noted. Psychiatric: Mood and affect are normal. Speech and behavior are normal. Patient  exhibits appropriate insight and judgement.   ____________________________________________   LABS (all labs ordered are listed, but only abnormal results are displayed)  Labs Reviewed  URINALYSIS, COMPLETE (UACMP) WITH MICROSCOPIC - Abnormal; Notable for the following components:      Result Value   Color, Urine YELLOW (*)    APPearance CLEAR (*)    All other components within normal limits  COMPREHENSIVE METABOLIC PANEL - Abnormal; Notable for the following components:   Total Bilirubin 1.3 (*)    All other components within normal limits  SARS CORONAVIRUS 2 (TAT 6-24 HRS)  CBC WITH DIFFERENTIAL/PLATELET  MAGNESIUM  PHOSPHORUS  TSH  T4, FREE  HIV ANTIBODY (ROUTINE TESTING W REFLEX)  HIV4GL SAVE TUBE  BASIC METABOLIC PANEL  CBC  PROTIME-INR  APTT  HEPARIN LEVEL (UNFRACTIONATED)  TROPONIN I (HIGH SENSITIVITY)  TROPONIN I (HIGH SENSITIVITY)   ____________________________________________  EKG   Afib ____________________________________________  RADIOLOGY Robinette Haines, personally viewed and evaluated these images (plain radiographs) as part of my medical decision making, as well as reviewing the written report by the radiologist.  Dg Chest Portable 1 View  Result Date: 06/19/2019 CLINICAL DATA:  Tachycardia EXAM: PORTABLE CHEST 1 VIEW COMPARISON:  None. FINDINGS: Top-normal heart size. Normal mediastinal contour. No pneumothorax. No pleural effusion. Lungs appear clear, with no acute consolidative airspace disease and no pulmonary edema. IMPRESSION: No active disease. Electronically Signed   By: Ilona Sorrel M.D.   On: 06/19/2019 16:51    ____________________________________________    PROCEDURES  Procedure(s) performed:    Procedures   ____________________________________________   INITIAL IMPRESSION / ASSESSMENT AND PLAN / ED COURSE  Pertinent labs & imaging results that were available during my care of the patient were reviewed by me and considered  in my medical decision making (see chart for details).  Review of the [place] CSRS was performed in accordance of the [place] prior to dispensing any controlled drugs.     Patient presents emergency department for evaluation of palpitations.  On arrival to the emergency department, patient's was in A. fib with a rate of 140s to 150s.  Cardizem was started.  Patient was will be admitted for A. fib.   Lab work is pending. Chest xray negative for acute pulmonary processes.   Report was given to Dr. Darvin Neighbours.    Earna Coder was evaluated in Emergency Department on 06/19/2019 for the symptoms described in the history of present illness. He was evaluated in the context of the global COVID-19 pandemic, which necessitated consideration that the patient might be at risk for infection with the SARS-CoV-2 virus that causes COVID-19. Institutional protocols and algorithms that pertain to the evaluation of patients at risk for COVID-19 are in a state of rapid change based on information released by regulatory bodies including the CDC and federal and state organizations. These policies  and algorithms were followed during the patient's care in the ED.   ____________________________________________  FINAL CLINICAL IMPRESSION(S) / ED DIAGNOSES  Final diagnoses:  Atrial fibrillation, unspecified type ([place])      NEW MEDICATIONS STARTED DURING THIS VISIT:  ED Discharge Orders    None          This chart was dictated using voice recognition software/Dragon. Despite best efforts to proofread, errors can occur which can change the meaning. Any change was purely unintentional.    Laban Emperor, PA-C 06/19/19 1950    Carrie Mew, MD 06/20/19 0010

## 2019-06-19 NOTE — ED Notes (Signed)
FIRST NURSE NOTE:  Pt reports feeling like his heart is out of rhythm, states he hasn't been feeling good for the past week, feeling lightheaded and almost passed out. Pt placed in wheelchair on arrival.

## 2019-06-20 ENCOUNTER — Observation Stay
Admit: 2019-06-20 | Discharge: 2019-06-20 | Disposition: A | Discharge status: Home / Self Care | Payer: Managed Care, Other (non HMO) | Attending: Internal Medicine | Admitting: Internal Medicine

## 2019-06-20 DIAGNOSIS — I10 Essential (primary) hypertension: Secondary | ICD-10-CM | POA: Diagnosis present

## 2019-06-20 DIAGNOSIS — Z20828 Contact with and (suspected) exposure to other viral communicable diseases: Secondary | ICD-10-CM | POA: Diagnosis present

## 2019-06-20 DIAGNOSIS — Z8349 Family history of other endocrine, nutritional and metabolic diseases: Secondary | ICD-10-CM | POA: Diagnosis not present

## 2019-06-20 DIAGNOSIS — I480 Paroxysmal atrial fibrillation: Secondary | ICD-10-CM | POA: Diagnosis present

## 2019-06-20 DIAGNOSIS — R55 Syncope and collapse: Secondary | ICD-10-CM | POA: Diagnosis present

## 2019-06-20 DIAGNOSIS — I4891 Unspecified atrial fibrillation: Secondary | ICD-10-CM | POA: Diagnosis present

## 2019-06-20 DIAGNOSIS — Z6841 Body Mass Index (BMI) 40.0 and over, adult: Secondary | ICD-10-CM | POA: Diagnosis not present

## 2019-06-20 DIAGNOSIS — Z823 Family history of stroke: Secondary | ICD-10-CM | POA: Diagnosis not present

## 2019-06-20 DIAGNOSIS — Z79899 Other long term (current) drug therapy: Secondary | ICD-10-CM | POA: Diagnosis not present

## 2019-06-20 DIAGNOSIS — Z8249 Family history of ischemic heart disease and other diseases of the circulatory system: Secondary | ICD-10-CM | POA: Diagnosis not present

## 2019-06-20 LAB — BASIC METABOLIC PANEL
Anion gap: 10 (ref 5–15)
BUN: 15 mg/dL (ref 6–20)
CO2: 26 mmol/L (ref 22–32)
Calcium: 8.9 mg/dL (ref 8.9–10.3)
Chloride: 104 mmol/L (ref 98–111)
Creatinine, Ser: 0.8 mg/dL (ref 0.61–1.24)
GFR calc Af Amer: >60 mL/min (ref >60)
GFR calc non Af Amer: >60 mL/min (ref >60)
Glucose, Bld: 84 mg/dL (ref 70–99)
Potassium: 4.4 mmol/L (ref 3.5–5.1)
Sodium: 140 mmol/L (ref 135–145)

## 2019-06-20 LAB — ECHOCARDIOGRAM COMPLETE
Height: 72 in
Weight: 5201.6 oz

## 2019-06-20 LAB — SARS CORONAVIRUS 2 (TAT 6-24 HRS): SARS Coronavirus 2: NEGATIVE

## 2019-06-20 LAB — CBC
HCT: 44.3 % (ref 39.0–52.0)
Hemoglobin: 14.6 g/dL (ref 13.0–17.0)
MCH: 31.3 pg (ref 26.0–34.0)
MCHC: 33 g/dL (ref 30.0–36.0)
MCV: 94.9 fL (ref 80.0–100.0)
Platelets: 223 10*3/uL (ref 150–400)
RBC: 4.67 MIL/uL (ref 4.22–5.81)
RDW: 13 % (ref 11.5–15.5)
WBC: 8 10*3/uL (ref 4.0–10.5)
nRBC: 0 % (ref 0.0–0.2)

## 2019-06-20 LAB — HEPARIN LEVEL (UNFRACTIONATED)
Heparin Unfractionated: 0.39 IU/mL (ref 0.30–0.70)
Heparin Unfractionated: 0.41 IU/mL (ref 0.30–0.70)

## 2019-06-20 LAB — HIV ANTIBODY (ROUTINE TESTING W REFLEX): HIV Screen 4th Generation wRfx: NONREACTIVE

## 2019-06-20 MED ORDER — APIXABAN 5 MG PO TABS
5.0000 mg | ORAL_TABLET | Freq: Two times a day (BID) | ORAL | Status: DC
  Administered 2019-06-20 – 2019-06-21 (×3): 5 mg via ORAL
  Filled 2019-06-20 (×3): qty 1

## 2019-06-20 MED ORDER — APIXABAN 5 MG PO TABS: 5.0000 mg | ORAL_TABLET | Freq: Two times a day (BID) | ORAL | Status: DC

## 2019-06-20 NOTE — TOC Transition Note (Signed)
Transition of Care [facility]) - CM/SW Discharge Note   Patient Details  Name: Bradley Sexton MRN: [id number] Date of Birth: Apr 16, 1971  Transition of Care [facility]) CM/SW Contact:  Elza Rafter, RN Phone Number: 06/20/2019, 11:30 AM   Clinical Narrative:   Patient admitted with A-fib.  $10 co-pay assist coupon provided for Eliquis.  Independent in all adls, denies issues accessing medical care, obtaining medications or with transportation.  Current with PCP.  No discharge needs identified at present by care manager or members of care team.      Final next level of care: Home/Self Care Barriers to Discharge: Continued Medical Work up   Patient Goals and CMS Choice Patient states their goals for this hospitalization and ongoing recovery are:: to go home      Discharge Placement                       Discharge Plan and Services                                     Social Determinants of Health (SDOH) Interventions     Readmission Risk Interventions No flowsheet data found.

## 2019-06-20 NOTE — Progress Notes (Signed)
Patient noted to have 2.07 second pause while on cardizem drip. No complaints at this time. Patient continues to be in afib with heart rate in 90's at this time. Dr. Brett Albino made aware.  Will be up to see patient.

## 2019-06-20 NOTE — Plan of Care (Signed)
  Problem: Health Behavior/Discharge Planning: Goal: Ability to manage health-related needs will improve Outcome: Progressing   Problem: Clinical Measurements: Goal: Diagnostic test results will improve Outcome: Progressing Goal: Cardiovascular complication will be avoided Outcome: Progressing   Problem: Pain Managment: Goal: General experience of comfort will improve Outcome: Progressing   Problem: Safety: Goal: Ability to remain free from injury will improve Outcome: Progressing

## 2019-06-20 NOTE — Progress Notes (Signed)
ANTICOAGULATION CONSULT NOTE -  Pharmacy Consult for Heparin drip Indication: atrial fibrillation  No Known Allergies  Patient Measurements: Height: 6' (182.9 cm) Weight: (!) 325 lb 4.2 oz (147.5 kg) IBW/kg (Calculated) : 77.6 Heparin Dosing Weight: 112.8 kg  Vital Signs: Temp: 99 F (37.2 C) (10/04 2006) Temp Source: Oral (10/04 2006) BP: 107/75 (10/05 0124) Pulse Rate: 75 (10/04 2006)  Labs: Recent Labs    06/19/19 1619 06/19/19 2000 06/20/19 0022  HGB 15.7  --   --   HCT 46.1  --   --   PLT 236  --   --   APTT  --  51*  --   LABPROT  --  14.1  --   INR  --  1.1  --   HEPARINUNFRC  --   --  0.41  CREATININE 0.85  --   --   TROPONINIHS 9 8  --     Estimated Creatinine Clearance: 162.2 mL/min (by C-G formula based on SCr of 0.85 mg/dL).   Medical History: Past Medical History:  Diagnosis Date  . Atrial fibrillation ([place]) 2016   X 1 WHEN GASSED AT [employer]  . Hypertension    Assessment: Patient is a 48yo male admitted with afib. Pharmacy consulted for Heparin dosing.  10/5 @ 0022 HL = 0.41, therapeutic x 1  Goal of Therapy:  Heparin level 0.3-0.7 units/ml Monitor platelets by anticoagulation protocol: Yes   Plan:  Continue Heparin at current rate.  Recheck HL at 0700 to confirm.   Check CBC daily per heparin protocol  Hart Robinsons, PharmD Clinical Pharmacist 06/20/2019   06/20/2019 1:36 AM

## 2019-06-20 NOTE — Consult Note (Addendum)
ANTICOAGULATION CONSULT NOTE - Initial Consult  Pharmacy Consult for Apixaban Indication: atrial fibrillation  No Known Allergies  Patient Measurements: Height: 6' (182.9 cm) Weight: (!) 325 lb 1.6 oz (147.5 kg) IBW/kg (Calculated) : 77.6   Vital Signs: Temp: 97.8 F (36.6 C) (10/05 0522) Temp Source: Oral (10/05 0522) BP: 115/78 (10/05 0938) Pulse Rate: 93 (10/05 0938)  Labs: Recent Labs    06/19/19 1619 06/19/19 2000 06/20/19 0022 06/20/19 0534 06/20/19 0710  HGB 15.7  --   --  14.6  --   HCT 46.1  --   --  44.3  --   PLT 236  --   --  223  --   APTT  --  51*  --   --   --   LABPROT  --  14.1  --   --   --   INR  --  1.1  --   --   --   HEPARINUNFRC  --   --  0.41  --  0.39  CREATININE 0.85  --   --  0.80  --   TROPONINIHS 9 8  --   --   --     Estimated Creatinine Clearance: 172.3 mL/min (by C-G formula based on SCr of 0.8 mg/dL).   Medical History: Past Medical History:  Diagnosis Date  . Atrial fibrillation ([place]) 2016   X 1 WHEN GASSED AT [employer]  . Hypertension     Assessment: Bradley Sexton  is a 48 y.o. male with a known history of hypertension and atrial fibrillation.  He is on no PTA anticoagulation.  Hgb and platelets seem stable, with slight decrease.  Hgb 15.7 --> 14.6.  Platelets 236 --> 223.  He is transitioning from heparin to apixaban.         Plan:  Discontinue heparin drip. Start apixaban 5 mg PO BID Monitor CBC per protocol  Gerald Dexter, PharmD Pharmacy Resident  06/20/2019 12:23 PM

## 2019-06-20 NOTE — Progress Notes (Addendum)
Pt was admitted on the floor with nos signs of distress. Pt alert and oriented x 4. VSS. Pt have cardizem drip running at 40m/hr and heparin drip running at 17 ml/hr. Ascom is wiith pt reach. Pt refused bed alarm but was educated about safety. Will continue to monitor.  Update [phone] CCMD called and reported a 2.38 paused. Page prime and Talked to Dr. DOsvaldo Humanand ordered to continue cardizem drip. Notified incoming shift. Will continue to monitor.

## 2019-06-20 NOTE — Plan of Care (Signed)
  Problem: Education: Goal: Knowledge of General Education information will improve Description Including pain rating scale, medication(s)/side effects and non-pharmacologic comfort measures Outcome: Progressing   Problem: Clinical Measurements: Goal: Cardiovascular complication will be avoided Outcome: Progressing   Problem: Safety: Goal: Ability to remain free from injury will improve Outcome: Progressing   

## 2019-06-20 NOTE — Progress Notes (Signed)
[place] [facility] [place] NAME: Bradley Sexton    MR#:  [id number]  DATE OF BIRTH:  Oct 21, 1970  SUBJECTIVE:   Patient states he is feeling better this morning.  He has not had any additional episodes of palpitations.  He denies any chest pain or shortness of breath.  REVIEW OF SYSTEMS:  Review of Systems  Constitutional: Negative for chills and fever.  HENT: Negative for congestion and sore throat.   Eyes: Negative for blurred vision and double vision.  Respiratory: Negative for cough and shortness of breath.   Cardiovascular: Negative for chest pain and palpitations.  Gastrointestinal: Negative for nausea and vomiting.  Genitourinary: Negative for dysuria and urgency.  Musculoskeletal: Negative for back pain and neck pain.  Neurological: Negative for dizziness and headaches.  Psychiatric/Behavioral: Negative for depression. The patient is not nervous/anxious.     DRUG ALLERGIES:  No Known Allergies VITALS:  Blood pressure 115/78, pulse 93, temperature 97.8 F (36.6 C), temperature source Oral, resp. rate 20, height 6' (1.829 m), weight (!) 147.5 kg, SpO2 100 %. PHYSICAL EXAMINATION:  Physical Exam  GENERAL:  Laying in the bed with no acute distress.  HEENT: Head atraumatic, normocephalic. Pupils equal, round, reactive to light and accommodation. No scleral icterus. Extraocular muscles intact. Oropharynx and nasopharynx clear.  NECK:  Supple, no jugular venous distention. No thyroid enlargement. LUNGS: Lungs are clear to auscultation bilaterally. No wheezes, crackles, rhonchi. No use of accessory muscles of respiration.  CARDIOVASCULAR: Irregularly irregular rhythm, regular rate, S1, S2 normal. No murmurs, rubs, or gallops.  ABDOMEN: Soft, nontender, nondistended. Bowel sounds present.  EXTREMITIES: No pedal edema, cyanosis, or clubbing.  NEUROLOGIC: CN 2-12 intact, no focal deficits. 5/5 muscle strength throughout all extremities. Sensation  intact throughout. Gait not checked.  PSYCHIATRIC: The patient is alert and oriented x 3.  SKIN: No obvious rash, lesion, or ulcer.  LABORATORY PANEL:  Male CBC Recent Labs  Lab 06/20/19 0534  WBC 8.0  HGB 14.6  HCT 44.3  PLT 223   ------------------------------------------------------------------------------------------------------------------ Chemistries  Recent Labs  Lab 06/19/19 1619 06/19/19 1624 06/20/19 0534  NA 139  --  140  K 4.2  --  4.4  CL 106  --  104  CO2 22  --  26  GLUCOSE 87  --  84  BUN 16  --  15  CREATININE 0.85  --  0.80  CALCIUM 9.5  --  8.9  MG  --  1.9  --   AST 28  --   --   ALT 30  --   --   ALKPHOS 74  --   --   BILITOT 1.3*  --   --    RADIOLOGY:  Dg Chest Portable 1 View  Result Date: 06/19/2019 CLINICAL DATA:  Tachycardia EXAM: PORTABLE CHEST 1 VIEW COMPARISON:  None. FINDINGS: Top-normal heart size. Normal mediastinal contour. No pneumothorax. No pleural effusion. Lungs appear clear, with no acute consolidative airspace disease and no pulmonary edema. IMPRESSION: No active disease. Electronically Signed   By: Ilona Sorrel M.D.   On: 06/19/2019 16:51   ASSESSMENT AND PLAN:   Paroxysmal atrial fibrillation with RVR- heart rate now in the 80s on Cardizem drip. -Stop Cardizem drip and transition to p.o. metoprolol -switch from heparin drip to Eliquis 5 mg twice daily -Cardiology consulted -ECHO ordered -TSH was normal -Cardiac monitoring   Syncope - Likely secondary to Afib -Treatment as above  Hypertension- BP has been well  controlled. -Continue home lisinopril -Metoprolol started this admission  DVT prophylaxis- Eliquis  Will plan to monitor for 24 hours on po metoprolol.  If heart rate is stable, will plan to discharge tomorrow.  All the records are reviewed and case discussed with Care Management/Social Worker. Management plans discussed with the patient, family and they are in agreement.  CODE STATUS: Full Code   TOTAL TIME TAKING CARE OF THIS PATIENT: 40 minutes.   More than 50% of the time was spent in counseling/coordination of care: YES  POSSIBLE D/C IN 1-2 DAYS, DEPENDING ON CLINICAL CONDITION.   Berna Spare Cyntha Brickman M.D on 06/20/2019 at 2:16 PM  Between 7am to 6pm - Pager [phone]  After 6pm go to [email or web address] - [employer] [facility]  Office  [phone]  CC: Primary care physician; Kirk Ruths, MD  Note: This dictation was prepared with Dragon dictation along with smaller phrase technology. Any transcriptional errors that result from this process are unintentional.

## 2019-06-20 NOTE — Progress Notes (Signed)
*  PRELIMINARY RESULTS* Echocardiogram 2D Echocardiogram has been performed.  Sherrie Sport 06/20/2019, 9:18 AM

## 2019-06-20 NOTE — Consult Note (Signed)
[facility] Cardiology  CARDIOLOGY CONSULT NOTE  Patient ID: Bradley Sexton MRN: [id number] DOB/AGE: 1970/11/08 48 y.o.  Admit date: 06/19/2019 Referring Physician Dr. Darvin Neighbours  Primary Physician Dr. Ouida Sills  Primary Cardiologist N/A Reason for Consultation Atrial fibrillation with rapid ventricular response  HPI: Mr. Bradley Sexton is a 48 year old male with a past medical history significant for hypertension, paroxysmal atrial fibrillation, and obesity who presented to the ED on 06/19/19 for a three day history of palpitations, dizziness, increased fatigue, and a near syncopal episode.  Denied associated chest pain or shortness of breath.  While in the ED, he was noted to be in atrial fibrillation with rapid ventricular response, rate of 125bpm, and was started on diltiazem drip with improvement in rate.  Troponin was negative x 2.  Chest xray was negative for acute cardiopulmonary disease.  All other labs unremarkable.  Currently admits to palpitations, but is otherwise asymptomatic with no evidence of chest pain or shortness of breath.    Mr. Kemmerer reports prior history of intermittent atrial fibrillation, but is not followed by a cardiologist.  Prior to admission, wasn't taking any rate lowering medications or antiarrhythmics.   Review of systems complete and found to be negative unless listed above     Past Medical History:  Diagnosis Date  . Atrial fibrillation ([place]) 2016   X 1 WHEN GASSED AT [employer]  . Hypertension     Past Surgical History:  Procedure Laterality Date  . NO PAST SURGERIES    . OPEN REDUCTION INTERNAL FIXATION (ORIF) DISTAL RADIAL FRACTURE Left 06/03/2018   Procedure: OPEN REDUCTION INTERNAL FIXATION (ORIF) DISTAL RADIAL FRACTURE;  Surgeon: Hessie Knows, MD;  Location: [facility] ORS;  Service: Orthopedics;  Laterality: Left;    Medications Prior to Admission  Medication Sig Dispense Refill Last Dose  . Flaxseed, Linseed, (FLAXSEED OIL PO) Take 3 capsules by mouth daily. 1300 mg per  cap   Past Week at Unknown time  . lisinopril (PRINIVIL,ZESTRIL) 20 MG tablet Take 1 tablet (20 mg total) by mouth daily. (Patient taking differently: Take 20 mg by mouth every morning. ) 90 tablet 3 06/19/2019 at 0800  . Multiple Vitamins-Minerals (MULTIVITAMIN WITH MINERALS) tablet Take 1 tablet by mouth daily.   Past Week at Unknown time   Social History   Socioeconomic History  . Marital status: Single    Spouse name: Not on file  . Number of children: Not on file  . Years of education: Not on file  . Highest education level: Not on file  Occupational History  . Not on file  Social Needs  . Financial resource strain: Not on file  . Food insecurity    Worry: Not on file    Inability: Not on file  . Transportation needs    Medical: Not on file    Non-medical: Not on file  Tobacco Use  . Smoking status: Never Smoker  . Smokeless tobacco: Never Used  Substance and Sexual Activity  . Alcohol use: Yes    Alcohol/week: 0.0 standard drinks    Comment: occasionally  . Drug use: No  . Sexual activity: Not on file  Lifestyle  . Physical activity    Days per week: Not on file    Minutes per session: Not on file  . Stress: Not on file  Relationships  . Social Herbalist on phone: Not on file    Gets together: Not on file    Attends religious service: Not on file  Active member of club or organization: Not on file    Attends meetings of clubs or organizations: Not on file    Relationship status: Not on file  . Intimate partner violence    Fear of current or ex partner: Not on file    Emotionally abused: Not on file    Physically abused: Not on file    Forced sexual activity: Not on file  Other Topics Concern  . Not on file  Social History Narrative  . Not on file    Family History  Problem Relation Age of Onset  . Heart disease Mother   . Stroke Mother   . Hypertension Mother   . Hyperlipidemia Mother   . Diabetes Mother   . Heart disease Paternal  Grandfather       Review of systems complete and found to be negative unless listed above      PHYSICAL EXAM  General: Well developed, obese, in no acute distress HEENT:  Normocephalic and atramatic Neck:  No JVD.  Lungs: Clear bilaterally to auscultation and percussion. Heart: Irregularly irregular, rate controlled . Normal S1 and S2 without gallops or murmurs.  Abdomen: Bowel sounds are positive, abdomen soft and non-tender  Msk:  Back normal, normal gait. Normal strength and tone for age. Extremities: No clubbing, cyanosis or edema.   Neuro: Alert and oriented X 3. Psych:  Good affect, responds appropriately  Labs:   Lab Results  Component Value Date   WBC 8.0 06/20/2019   HGB 14.6 06/20/2019   HCT 44.3 06/20/2019   MCV 94.9 06/20/2019   PLT 223 06/20/2019    Recent Labs  Lab 06/19/19 1619 06/20/19 0534  NA 139 140  K 4.2 4.4  CL 106 104  CO2 22 26  BUN 16 15  CREATININE 0.85 0.80  CALCIUM 9.5 8.9  PROT 7.4  --   BILITOT 1.3*  --   ALKPHOS 74  --   ALT 30  --   AST 28  --   GLUCOSE 87 84   No results found for: CKTOTAL, CKMB, CKMBINDEX, TROPONINI  Lab Results  Component Value Date   CHOL 155 01/22/2018   CHOL 155 01/21/2017   CHOL 193 01/11/2016   Lab Results  Component Value Date   HDL 38 (L) 01/22/2018   HDL 32 (L) 01/21/2017   HDL 33 (L) 01/11/2016   Lab Results  Component Value Date   LDLCALC 104 (H) 01/22/2018   LDLCALC 112 (H) 01/21/2017   LDLCALC 140 (H) 01/11/2016   Lab Results  Component Value Date   TRIG 63 01/22/2018   TRIG 54 01/21/2017   TRIG 101 01/11/2016   Lab Results  Component Value Date   CHOLHDL 4.1 01/22/2018   CHOLHDL 4.8 01/21/2017   CHOLHDL 5.8 (H) 01/11/2016   No results found for: LDLDIRECT    Radiology: Dg Chest Portable 1 View  Result Date: 06/19/2019 CLINICAL DATA:  Tachycardia EXAM: PORTABLE CHEST 1 VIEW COMPARISON:  None. FINDINGS: Top-normal heart size. Normal mediastinal contour. No pneumothorax.  No pleural effusion. Lungs appear clear, with no acute consolidative airspace disease and no pulmonary edema. IMPRESSION: No active disease. Electronically Signed   By: Ilona Sorrel M.D.   On: 06/19/2019 16:51    EKG: Atrial fibrillation with rapid ventricular response   ASSESSMENT AND PLAN:  1.  Atrial fibrillation   -Unsure of exact onset due to continued nighttime symptoms of palpitations; will defer cardioversion at this time  -If patient doesn't convert  while in the hospital, may consider discharging on Eliquis 19m BID for plans of outpatient cardioversion   -Rate is better controlled; okay to d/c diltiazem drip and remain on metoprolol 512mBID   -Echocardiogram pending   -Discussed establishing care with cardiology in an outpatient setting and obtaining sleep study in an outpatient setting as well as outpatient stress test  2.  Hypertension   -Continue lisinopril 1013maily; will discharge on metoprolol 78m1mD  3.  Syncope   -Likely due to underlying atrial fibrillation with RVR; will obtain echocardiogram and make further recommendations    The history, physical exam findings, and plan of care were all discussed with Dr. KennBartholome Billd all decision making was made in collaboration.     Signed: NicoAvie ArenasC 06/20/2019, 9:40 AM

## 2019-06-20 NOTE — Progress Notes (Signed)
ANTICOAGULATION CONSULT NOTE -  Pharmacy Consult for Heparin drip Indication: atrial fibrillation  No Known Allergies  Patient Measurements: Height: 6' (182.9 cm) Weight: (!) 325 lb 1.6 oz (147.5 kg) IBW/kg (Calculated) : 77.6 Heparin Dosing Weight: 112.8 kg  Vital Signs: Temp: 97.8 F (36.6 C) (10/05 0522) Temp Source: Oral (10/05 0522) BP: 104/71 (10/05 0708) Pulse Rate: 71 (10/05 0522)  Labs: Recent Labs    06/19/19 1619 06/19/19 2000 06/20/19 0022 06/20/19 0534 06/20/19 0710  HGB 15.7  --   --  14.6  --   HCT 46.1  --   --  44.3  --   PLT 236  --   --  223  --   APTT  --  51*  --   --   --   LABPROT  --  14.1  --   --   --   INR  --  1.1  --   --   --   HEPARINUNFRC  --   --  0.41  --  0.39  CREATININE 0.85  --   --  0.80  --   TROPONINIHS 9 8  --   --   --     Estimated Creatinine Clearance: 172.3 mL/min (by C-G formula based on SCr of 0.8 mg/dL).   Medical History: Past Medical History:  Diagnosis Date  . Atrial fibrillation ([place]) 2016   X 1 WHEN GASSED AT [employer]  . Hypertension    Assessment: Patient is a 48yo male admitted with afib. Pharmacy consulted for Heparin dosing.  10/5 @ 0022 HL = 0.41, therapeutic x 1 10/5 @ 0710 HL = 0.39, therapeutic x 2  Goal of Therapy:  Heparin level 0.3-0.7 units/ml Monitor platelets by anticoagulation protocol: Yes   Plan:  Continue Heparin at current rate.  Recheck HL in the AM. Check CBC daily per heparin protocol  Gerald Dexter, PharmD Pharmacy Resident  06/20/2019 7:48 AM

## 2019-06-21 LAB — CBC
HCT: 44 % (ref 39.0–52.0)
Hemoglobin: 14.6 g/dL (ref 13.0–17.0)
MCH: 30.9 pg (ref 26.0–34.0)
MCHC: 33.2 g/dL (ref 30.0–36.0)
MCV: 93 fL (ref 80.0–100.0)
Platelets: 214 10*3/uL (ref 150–400)
RBC: 4.73 MIL/uL (ref 4.22–5.81)
RDW: 12.6 % (ref 11.5–15.5)
WBC: 7.4 10*3/uL (ref 4.0–10.5)
nRBC: 0 % (ref 0.0–0.2)

## 2019-06-21 MED ORDER — METOPROLOL TARTRATE 50 MG PO TABS: 50.0000 mg | ORAL_TABLET | Freq: Two times a day (BID) | ORAL | 0 refills | Status: AC

## 2019-06-21 MED ORDER — APIXABAN 5 MG PO TABS: 5.0000 mg | ORAL_TABLET | Freq: Two times a day (BID) | ORAL | 0 refills | Status: AC

## 2019-06-21 NOTE — Progress Notes (Signed)
[facility] Cardiology  SUBJECTIVE: Bradley Sexton is a 48 year old male with a past medical history significant for hypertension, paroxysmal atrial fibrillation, and obesity who presented to the ED on 06/19/19 for a three day history of palpitations, dizziness, increased fatigue, and a near syncopal episode.  Denied associated chest pain or shortness of breath.  While in the ED, he was noted to be in atrial fibrillation with rapid ventricular response, rate of 125bpm, and was started on diltiazem drip with improvement in rate.  Diltiazem drip has been d/c and he is now on metoprolol tartrate 70m BID with adequate rate control.     Vitals:   06/20/19 2022 06/20/19 2255 06/21/19 0538 06/21/19 0540  BP: (!) 121/100  103/72 (!) 114/91  Pulse: (!) 38 86 (!) 42 (!) 59  Resp:   20   Temp: 98 F (36.7 C)  (!) 97.5 F (36.4 C)   TempSrc: Oral  Oral   SpO2: 98%  95%   Weight:   (!) 146.7 kg   Height:         Intake/Output Summary (Last 24 hours) at 06/21/2019 0735 Last data filed at 06/21/2019 0059 Gross per 24 hour  Intake 960 ml  Output 0 ml  Net 960 ml      PHYSICAL EXAM  General: Well developed, obese, in no acute distress HEENT:  Normocephalic and atramatic Neck:  No JVD.  Lungs: Clear bilaterally to auscultation and percussion. Heart: Irregularly irregular, varying ventricular response . Normal S1 and S2 without gallops or murmurs.  Abdomen: Bowel sounds are positive, abdomen soft and non-tender  Msk:  Back normal, normal gait. Normal strength and tone for age. Extremities: No clubbing, cyanosis or edema.   Neuro: Alert and oriented X 3. Psych:  Good affect, responds appropriately   LABS: Basic Metabolic Panel: Recent Labs    06/19/19 1619 06/19/19 1624 06/20/19 0534  NA 139  --  140  K 4.2  --  4.4  CL 106  --  104  CO2 22  --  26  GLUCOSE 87  --  84  BUN 16  --  15  CREATININE 0.85  --  0.80  CALCIUM 9.5  --  8.9  MG  --  1.9  --   PHOS  --  3.3  --    Liver Function  Tests: Recent Labs    06/19/19 1619  AST 28  ALT 30  ALKPHOS 74  BILITOT 1.3*  PROT 7.4  ALBUMIN 4.1   No results for input(s): LIPASE, AMYLASE in the last 72 hours. CBC: Recent Labs    06/19/19 1619 06/20/19 0534 06/21/19 0508  WBC 7.8 8.0 7.4  NEUTROABS 4.8  --   --   HGB 15.7 14.6 14.6  HCT 46.1 44.3 44.0  MCV 92.0 94.9 93.0  PLT 236 223 214   Cardiac Enzymes: No results for input(s): CKTOTAL, CKMB, CKMBINDEX, TROPONINI in the last 72 hours. BNP: Invalid input(s): POCBNP D-Dimer: No results for input(s): DDIMER in the last 72 hours. Hemoglobin A1C: No results for input(s): HGBA1C in the last 72 hours. Fasting Lipid Panel: No results for input(s): CHOL, HDL, LDLCALC, TRIG, CHOLHDL, LDLDIRECT in the last 72 hours. Thyroid Function Tests: Recent Labs    06/19/19 1624  TSH 3.440   Anemia Panel: No results for input(s): VITAMINB12, FOLATE, FERRITIN, TIBC, IRON, RETICCTPCT in the last 72 hours.  Dg Chest Portable 1 View  Result Date: 06/19/2019 CLINICAL DATA:  Tachycardia EXAM: PORTABLE CHEST 1  VIEW COMPARISON:  None. FINDINGS: Top-normal heart size. Normal mediastinal contour. No pneumothorax. No pleural effusion. Lungs appear clear, with no acute consolidative airspace disease and no pulmonary edema. IMPRESSION: No active disease. Electronically Signed   By: Ilona Sorrel M.D.   On: 06/19/2019 16:51     Echo: Normal LV systolic function with an EF estimated between 55-60% with no significant valvular abnormalities.  Right ventricle mildly enlarged with mildly elevated right sided pressures.   TELEMETRY: Atrial fibrillation   ASSESSMENT AND PLAN:  Principal Problem:   A-fib ([place]) Active Problems:   Atrial fibrillation with RVR ([facility])    1.  Atrial fibrillation   -Will discharge on Eliquis 52m twice daily   -Continue with metoprolol 556mBID for now; will continue to monitor BP and HR  -Will need to establish care with cardiology following discharge for  outpatient sleep study/consideration of stress test  2.  Hypertension   -Continue home medication regimen with addition of metoprolol 5068mID  3.  Syncope   -Likely due to underlying atrial fibrillation with RVR; echocardiogram essentially normal with slightly elevated right sided pressures   The history, physical exam findings, and plan of care were all discussed with Dr. KenBartholome Billnd all decision making was made in collaboration.   Bradley Sexton 06/21/2019 7:35 AM

## 2019-06-21 NOTE — Discharge Instructions (Signed)
It was so nice to meet you during this hospitalization!  You came into the hospital with A-fib with a rapid heart rate. We have started you on two new medicines: 1. Please take metoprolol 46m twice a day. This will help control your heart rate. 2. Please take eliquis 555mtwice a day. This is a blood thinner that will help decrease your risk of heart attack and stroke.  Please make sure you follow-up with the heart doctor in 1-2 weeks.  Take care, Dr. MaBrett Albino

## 2019-06-21 NOTE — Consult Note (Signed)
ANTICOAGULATION CONSULT NOTE - Initial Consult  Pharmacy Consult for Apixaban Indication: atrial fibrillation  No Known Allergies  Patient Measurements: Height: 6' (182.9 cm) Weight: (!) 323 lb 6.4 oz (146.7 kg) IBW/kg (Calculated) : 77.6   Vital Signs: Temp: 97.5 F (36.4 C) (10/06 0538) Temp Source: Oral (10/06 0538) BP: 114/91 (10/06 0540) Pulse Rate: 59 (10/06 0540)  Labs: Recent Labs    06/19/19 1619 06/19/19 2000 06/20/19 0022 06/20/19 0534 06/20/19 0710 06/21/19 0508  HGB 15.7  --   --  14.6  --  14.6  HCT 46.1  --   --  44.3  --  44.0  PLT 236  --   --  223  --  214  APTT  --  51*  --   --   --   --   LABPROT  --  14.1  --   --   --   --   INR  --  1.1  --   --   --   --   HEPARINUNFRC  --   --  0.41  --  0.39  --   CREATININE 0.85  --   --  0.80  --   --   TROPONINIHS 9 8  --   --   --   --     Estimated Creatinine Clearance: 171.7 mL/min (by C-G formula based on SCr of 0.8 mg/dL).   Medical History: Past Medical History:  Diagnosis Date  . Atrial fibrillation ([place]) 2016   X 1 WHEN GASSED AT [employer]  . Hypertension     Assessment: Bradley Sexton  is a 48 y.o. male with a known history of hypertension and atrial fibrillation.  He is on no PTA anticoagulation.  Hgb and platelets seem stable, with slight decrease.  Hgb 15.7 --> 14.6.  Platelets 236 --> 223 --> 214.  He is a new apixaban patient recently transitioned from heparin.         Plan:  Continue apixaban 5 mg PO BID  Monitor CBC per protocol  Gerald Dexter, PharmD Pharmacy Resident  06/21/2019 6:53 AM

## 2019-06-21 NOTE — Progress Notes (Signed)
IVs and tele removed from patient. Discharge instructions given to patient. Verbalized understanding. No acute distress at this time. Patient to transport himself home.

## 2019-06-21 NOTE — Discharge Summary (Addendum)
[name] [place] NAME: Bradley Sexton    MR#:  [id number]  DATE OF BIRTH:  07/01/1971  DATE OF ADMISSION:  06/19/2019   ADMITTING PHYSICIAN: Hillary Bow, MD  DATE OF DISCHARGE: 06/21/19  PRIMARY CARE PHYSICIAN: Kirk Ruths, MD   ADMISSION DIAGNOSIS:  Atrial fibrillation, unspecified type ([place]) [I48.91] DISCHARGE DIAGNOSIS:  Principal Problem:   A-fib ([place]) Active Problems:   Atrial fibrillation with RVR ([place])  SECONDARY DIAGNOSIS:   Past Medical History:  Diagnosis Date   Atrial fibrillation ([place]) 2016   X 1 WHEN GASSED AT [employer]   Hypertension    HOSPITAL COURSE:   Bradley Sexton is a 48 year old male who presented to the ED with palpitations and a syncopal episode.  In the ED, he was found to be in A. fib with RVR.  He was started on a diltiazem drip.  He was admitted for further management.  Paroxysmal atrial fibrillation-initially with RVR, but his heart rates were normal at the time of discharge -Initially on a Cardizem drip and then transitioned to p.o. metoprolol 50 mg twice daily -initially on a heparin drip, but he was transitioned to Eliquis 5 mg twice daily -Seen by cardiology, who recommended outpatient follow-up for outpatient sleep study and consideration of a stress test -ECHO with EF 55-60% -TSH was normal -Patient will follow-up with cardiology as an outpatient in 1-2 weeks   Syncope - Likely secondary to Afib -Treatment as above   Hypertension- BP has been well controlled. -Continue home lisinopril -Metoprolol started this admission  Severe obesity- BMI 43.86 -Advised lifestyle modifications  DISCHARGE CONDITIONS:  Paroxysmal atrial fibrillation Hypertension CONSULTS OBTAINED:  Treatment Team:  Teodoro Spray, MD DRUG ALLERGIES:  No Known Allergies DISCHARGE MEDICATIONS:   Allergies as of 06/21/2019   No Known Allergies      Medication List     TAKE these medications     apixaban 5 MG Tabs tablet Commonly known as: ELIQUIS Take 1 tablet (5 mg total) by mouth 2 (two) times daily.   FLAXSEED OIL PO Take 3 capsules by mouth daily. 1300 mg per cap   lisinopril 20 MG tablet Commonly known as: ZESTRIL Take 1 tablet (20 mg total) by mouth daily. What changed: when to take this   metoprolol tartrate 50 MG tablet Commonly known as: LOPRESSOR Take 1 tablet (50 mg total) by mouth 2 (two) times daily.   multivitamin with minerals tablet Take 1 tablet by mouth daily.         DISCHARGE INSTRUCTIONS:  1.  Follow-up with PCP in 5 days 2.  Follow-up with cardiology in 1-2 weeks 3.  Take metoprolol 50 mg p.o. twice daily 4.  Take Eliquis 5 mg twice daily DIET:  Cardiac diet DISCHARGE CONDITION:  Stable ACTIVITY:  Activity as tolerated OXYGEN:  Home Oxygen: No.  Oxygen Delivery: room air DISCHARGE LOCATION:  home   If you experience worsening of your admission symptoms, develop shortness of breath, life threatening emergency, suicidal or homicidal thoughts you must seek medical attention immediately by calling 911 or calling your MD immediately  if symptoms less severe.  You Must read complete instructions/literature along with all the possible adverse reactions/side effects for all the Medicines you take and that have been prescribed to you. Take any new Medicines after you have completely understood and accpet all the possible adverse reactions/side effects.   Please note  You were cared for by a hospitalist during your hospital  stay. If you have any questions about your discharge medications or the care you received while you were in the hospital after you are discharged, you can call the unit and asked to speak with the hospitalist on call if the hospitalist that took care of you is not available. Once you are discharged, your primary care physician will handle any further medical issues. Please note that NO REFILLS for any discharge medications will  be authorized once you are discharged, as it is imperative that you return to your primary care physician (or establish a relationship with a primary care physician if you do not have one) for your aftercare needs so that they can reassess your need for medications and monitor your lab values.    On the day of Discharge:  VITAL SIGNS:  Blood pressure 116/77, pulse 77, temperature 97.7 F (36.5 C), temperature source Oral, resp. rate 19, height 6' (1.829 m), weight (!) 146.7 kg, SpO2 98 %. PHYSICAL EXAMINATION:  GENERAL:  48 y.o.-year-old patient lying in the bed with no acute distress.  EYES: Pupils equal, round, reactive to light and accommodation. No scleral icterus. Extraocular muscles intact.  HEENT: Head atraumatic, normocephalic. Oropharynx and nasopharynx clear.  NECK:  Supple, no jugular venous distention. No thyroid enlargement, no tenderness.  LUNGS: Normal breath sounds bilaterally, no wheezing, rales,rhonchi or crepitation. No use of accessory muscles of respiration.  CARDIOVASCULAR: Irregularly irregular rhythm, regular rate, S1, S2 normal. No murmurs, rubs, or gallops.  ABDOMEN: Soft, non-tender, non-distended. Bowel sounds present. No organomegaly or mass.  EXTREMITIES: No pedal edema, cyanosis, or clubbing.  NEUROLOGIC: Cranial nerves II through XII are intact. Muscle strength 5/5 in all extremities. Sensation intact. Gait not checked.  PSYCHIATRIC: The patient is alert and oriented x 3.  SKIN: No obvious rash, lesion, or ulcer.  DATA REVIEW:   CBC Recent Labs  Lab 06/21/19 0508  WBC 7.4  HGB 14.6  HCT 44.0  PLT 214    Chemistries  Recent Labs  Lab 06/19/19 1619 06/19/19 1624 06/20/19 0534  NA 139  --  140  K 4.2  --  4.4  CL 106  --  104  CO2 22  --  26  GLUCOSE 87  --  84  BUN 16  --  15  CREATININE 0.85  --  0.80  CALCIUM 9.5  --  8.9  MG  --  1.9  --   AST 28  --   --   ALT 30  --   --   ALKPHOS 74  --   --   BILITOT 1.3*  --   --       Microbiology Results  Results for orders placed or performed during the hospital encounter of 06/19/19  SARS CORONAVIRUS 2 (TAT 6-24 HRS) Nasopharyngeal Nasopharyngeal Swab     Status: None   Collection Time: 06/19/19  5:45 PM   Specimen: Nasopharyngeal Swab  Result Value Ref Range Status   SARS Coronavirus 2 NEGATIVE NEGATIVE Final    Comment: (NOTE) SARS-CoV-2 target nucleic acids are NOT DETECTED. The SARS-CoV-2 RNA is generally detectable in upper and lower respiratory specimens during the acute phase of infection. Negative results do not preclude SARS-CoV-2 infection, do not rule out co-infections with other pathogens, and should not be used as the sole basis for treatment or other patient management decisions. Negative results must be combined with clinical observations, patient history, and epidemiological information. The expected result is Negative. Fact Sheet for Patients: SugarRoll.be Fact Sheet for  Healthcare Providers: https://www.woods-mathews.com/ This test is not yet approved or cleared by the [place] and  has been authorized for detection and/or diagnosis of SARS-CoV-2 by FDA under an Emergency Use Authorization (EUA). This EUA will remain  in effect (meaning this test can be used) for the duration of the COVID-19 declaration under Section 56 4(b)(1) of the Act, 21 U.S.C. section 360bbb-3(b)(1), unless the authorization is terminated or revoked sooner. Performed at [facility], [place] [address] [place], [place] [address]     RADIOLOGY:  No results found.   Management plans discussed with the patient, family and they are in agreement.  CODE STATUS: Full Code   TOTAL TIME TAKING CARE OF THIS PATIENT: 40 minutes.    Berna Spare Bradley Sexton M.D on 06/21/2019 at 10:25 AM  Between 7am to 6pm - Pager - [phone]  After 6pm go to [email or web address] - Technical brewer [facility]   Office  [phone]  CC: Primary care physician; Kirk Ruths, MD   Note: This dictation was prepared with Dragon dictation along with smaller phrase technology. Any transcriptional errors that result from this process are unintentional.

## 2019-06-21 NOTE — Progress Notes (Addendum)
Pt heart rate increased between 130-158, while up to the bathroom and brushing his teeth. Nurse educated pt to call for assistance when needing to get up due to increased heart rate. Pt reports fluttering feeling off and on.

## 2019-08-25 ENCOUNTER — Other Ambulatory Visit: Payer: Self-pay | Admitting: Cardiology

## 2019-08-25 DIAGNOSIS — Z01818 Encounter for other preprocedural examination: Secondary | ICD-10-CM

## 2019-08-30 ENCOUNTER — Other Ambulatory Visit: Payer: Self-pay | Admitting: Cardiology

## 2019-08-30 DIAGNOSIS — Z01818 Encounter for other preprocedural examination: Secondary | ICD-10-CM

## 2019-09-02 ENCOUNTER — Other Ambulatory Visit
Admission: RE | Admit: 2019-09-02 | Discharge: 2019-09-02 | Disposition: A | Discharge status: Home / Self Care | Payer: Managed Care, Other (non HMO) | Source: Ambulatory Visit | Attending: Cardiology | Admitting: Cardiology

## 2019-09-02 DIAGNOSIS — Z01812 Encounter for preprocedural laboratory examination: Secondary | ICD-10-CM | POA: Insufficient documentation

## 2019-09-02 DIAGNOSIS — Z20828 Contact with and (suspected) exposure to other viral communicable diseases: Secondary | ICD-10-CM | POA: Insufficient documentation

## 2019-09-02 LAB — SARS CORONAVIRUS 2 (TAT 6-24 HRS): SARS Coronavirus 2: NEGATIVE

## 2019-09-05 ENCOUNTER — Other Ambulatory Visit: Payer: Self-pay | Admitting: Cardiology

## 2019-09-06 ENCOUNTER — Ambulatory Visit: Payer: Managed Care, Other (non HMO) | Admitting: Anesthesiology

## 2019-09-06 ENCOUNTER — Other Ambulatory Visit: Payer: Self-pay

## 2019-09-06 ENCOUNTER — Encounter
Admission: RE | Disposition: A | Discharge status: Home / Self Care | Payer: Self-pay | Source: Home / Self Care | Attending: Cardiology

## 2019-09-06 ENCOUNTER — Ambulatory Visit
Admission: RE | Admit: 2019-09-06 | Discharge: 2019-09-06 | Disposition: A | Discharge status: Home / Self Care | Payer: Managed Care, Other (non HMO) | Attending: Cardiology | Admitting: Cardiology

## 2019-09-06 ENCOUNTER — Encounter: Payer: Self-pay | Admitting: Cardiology

## 2019-09-06 DIAGNOSIS — I10 Essential (primary) hypertension: Secondary | ICD-10-CM | POA: Insufficient documentation

## 2019-09-06 DIAGNOSIS — G4733 Obstructive sleep apnea (adult) (pediatric): Secondary | ICD-10-CM | POA: Insufficient documentation

## 2019-09-06 DIAGNOSIS — I4891 Unspecified atrial fibrillation: Secondary | ICD-10-CM | POA: Diagnosis not present

## 2019-09-06 DIAGNOSIS — Z6841 Body Mass Index (BMI) 40.0 and over, adult: Secondary | ICD-10-CM | POA: Diagnosis not present

## 2019-09-06 DIAGNOSIS — Z79899 Other long term (current) drug therapy: Secondary | ICD-10-CM | POA: Diagnosis not present

## 2019-09-06 DIAGNOSIS — Z7901 Long term (current) use of anticoagulants: Secondary | ICD-10-CM | POA: Insufficient documentation

## 2019-09-06 HISTORY — PX: CARDIOVERSION: SHX1299

## 2019-09-06 SURGERY — CARDIOVERSION
Anesthesia: General

## 2019-09-06 MED ORDER — HYDROCORTISONE 1 % EX CREA
1.0000 "application " | TOPICAL_CREAM | Freq: Three times a day (TID) | CUTANEOUS | Status: DC | PRN
  Filled 2019-09-06: qty 28

## 2019-09-06 MED ORDER — SODIUM CHLORIDE 0.9 % IV SOLN: INTRAVENOUS | Status: DC

## 2019-09-06 MED ORDER — PROPOFOL 10 MG/ML IV BOLUS
INTRAVENOUS | Status: AC
  Filled 2019-09-06: qty 20

## 2019-09-06 MED ORDER — EPHEDRINE SULFATE 50 MG/ML IJ SOLN
INTRAMUSCULAR | Status: AC
  Filled 2019-09-06: qty 1

## 2019-09-06 NOTE — Transfer of Care (Signed)
Immediate Anesthesia Transfer of Care Note  Patient: Bradley Sexton  Procedure(s) Performed: CARDIOVERSION (N/A )  Patient Location: PACU  Anesthesia Type:General  Level of Consciousness: sedated  Airway & Oxygen Therapy: Patient Spontanous Breathing and Patient connected to nasal cannula oxygen  Post-op Assessment: Report given to RN and Post -op Vital signs reviewed and stable  Post vital signs: Reviewed and stable  Last Vitals:  Vitals Value Taken Time  BP    Temp    Pulse    Resp    SpO2      Last Pain:  Vitals:   09/06/19 0704  TempSrc: Oral  PainSc: 0-No pain         Complications: No apparent anesthesia complications

## 2019-09-06 NOTE — Anesthesia Postprocedure Evaluation (Signed)
Anesthesia Post Note  Patient: Bradley Sexton  Procedure(s) Performed: CARDIOVERSION (N/A )  Patient location during evaluation: PACU Anesthesia Type: General Level of consciousness: awake and alert Pain management: pain level controlled Vital Signs Assessment: post-procedure vital signs reviewed and stable Respiratory status: spontaneous breathing, nonlabored ventilation and respiratory function stable Cardiovascular status: blood pressure returned to baseline and stable Postop Assessment: no apparent nausea or vomiting Anesthetic complications: no     Last Vitals:  Vitals:   09/06/19 0815 09/06/19 0826  BP: (!) 121/92 127/85  Pulse: (!) 53 (!) 50  Resp: 14 12  Temp:    SpO2: 97% 95%    Last Pain:  Vitals:   09/06/19 0826  TempSrc:   PainSc: 0-No pain                 Tera Mater

## 2019-09-06 NOTE — Anesthesia Preprocedure Evaluation (Addendum)
Anesthesia Evaluation  Patient identified by MRN, date of birth, ID band Patient awake    Reviewed: Allergy & Precautions, H&P , NPO status , Patient's Chart, lab work & pertinent test results  Airway Mallampati: III  TM Distance: >3 FB     Dental  (+) Chipped   Pulmonary sleep apnea and Continuous Positive Airway Pressure Ventilation ,           Cardiovascular hypertension, + dysrhythmias Atrial Fibrillation      Neuro/Psych negative neurological ROS  negative psych ROS   GI/Hepatic negative GI ROS, Neg liver ROS,   Endo/Other  Morbid obesity  Renal/GU negative Renal ROS  negative genitourinary   Musculoskeletal   Abdominal   Peds  Hematology negative hematology ROS (+)   Anesthesia Other Findings Past Medical History: 2016: Atrial fibrillation (HCC)     Comment:  X 1 WHEN GASSED AT [employer] No date: Hypertension  Past Surgical History: No date: NO PAST SURGERIES 06/03/2018: OPEN REDUCTION INTERNAL FIXATION (ORIF) DISTAL RADIAL  FRACTURE; Left     Comment:  Procedure: OPEN REDUCTION INTERNAL FIXATION (ORIF)               DISTAL RADIAL FRACTURE;  Surgeon: Hessie Knows, MD;                Location: [facility] ORS;  Service: Orthopedics;  Laterality:               Left;     Reproductive/Obstetrics negative OB ROS                            Anesthesia Physical Anesthesia Plan  ASA: III  Anesthesia Plan: General   Post-op Pain Management:    Induction:   PONV Risk Score and Plan:   Airway Management Planned: Nasal Cannula  Additional Equipment:   Intra-op Plan:   Post-operative Plan:   Informed Consent: I have reviewed the patients History and Physical, chart, labs and discussed the procedure including the risks, benefits and alternatives for the proposed anesthesia with the patient or authorized representative who has indicated his/her understanding and acceptance.      Dental Advisory Given  Plan Discussed with: Anesthesiologist  Anesthesia Plan Comments:        Anesthesia Quick Evaluation

## 2019-09-06 NOTE — Discharge Summary (Signed)
Procedure:  Cardioversion  Indication: symptomatic afib  After informed consent, time out protocol and adequate sedation per department of anesthesia, pt received 120J, 150J and 200J [place] DC shock with conversion to nsr on last attempt. No immediate complications.

## 2019-09-06 NOTE — Discharge Instructions (Signed)
Electrical Cardioversion, Care After This sheet gives you information about how to care for yourself after your procedure. Your health care provider may also give you more specific instructions. If you have problems or questions, contact your health care provider. What can I expect after the procedure? After the procedure, it is common to have:  Some redness on the skin where the shocks were given. Follow these instructions at home:   Do not drive for 24 hours if you were given a medicine to help you relax (sedative).  Take over-the-counter and prescription medicines only as told by your health care provider.  Ask your health care provider how to check your pulse. Check it often.  Rest for 48 hours after the procedure or as told by your health care provider.  Avoid or limit your caffeine use as told by your health care provider. Contact a health care provider if:  You feel like your heart is beating too quickly or your pulse is not regular.  You have a serious muscle cramp that does not go away. Get help right away if:   You have discomfort in your chest.  You are dizzy or you feel faint.  You have trouble breathing or you are short of breath.  Your speech is slurred.  You have trouble moving an arm or leg on one side of your body.  Your fingers or toes turn cold or blue. This information is not intended to replace advice given to you by your health care provider. Make sure you discuss any questions you have with your health care provider. Document Released: 06/22/2013 Document Revised: 08/14/2017 Document Reviewed: 03/07/2016 Elsevier Patient Education  [place].    Moderate Conscious Sedation, Adult, Care After These instructions provide you with information about caring for yourself after your procedure. Your health care provider may also give you more specific instructions. Your treatment has been planned according to current medical practices, but problems  sometimes occur. Call your health care provider if you have any problems or questions after your procedure. What can I expect after the procedure? After your procedure, it is common:  To feel sleepy for several hours.  To feel clumsy and have poor balance for several hours.  To have poor judgment for several hours.  To vomit if you eat too soon. Follow these instructions at home: For at least 24 hours after the procedure:   Do not: ? Participate in activities where you could fall or become injured. ? Drive. ? Use heavy machinery. ? Drink alcohol. ? Take sleeping pills or medicines that cause drowsiness. ? Make important decisions or sign legal documents. ? Take care of children on your own.  Rest. Eating and drinking  Follow the diet recommended by your health care provider.  If you vomit: ? Drink water, juice, or soup when you can drink without vomiting. ? Make sure you have little or no nausea before eating solid foods. General instructions  Have a responsible adult stay with you until you are awake and alert.  Take over-the-counter and prescription medicines only as told by your health care provider.  If you smoke, do not smoke without supervision.  Keep all follow-up visits as told by your health care provider. This is important. Contact a health care provider if:  You keep feeling nauseous or you keep vomiting.  You feel light-headed.  You develop a rash.  You have a fever. Get help right away if:  You have trouble breathing. This information is  not intended to replace advice given to you by your health care provider. Make sure you discuss any questions you have with your health care provider. Document Released: 06/22/2013 Document Revised: 08/14/2017 Document Reviewed: 12/22/2015 Elsevier Patient Education  2020 [employer].

## 2019-09-06 NOTE — H&P (Signed)
Chief Complaint: Chief Complaint  Patient presents with  . Follow-up  holter and sleep study  Date of Service: 07/26/2019 Date of Birth: [birth year] PCP: Harrold Donath, MD  History of Present Illness: Bradley Sexton is a 48 y.o.male patient with a past medical history significant for atrial fibrillation, anticoagulated on Eliquis, hypertension, and morbid obesity who presents to review Holter monitor and sleep study results. He was recently admitted to [facility] with atrial fibrillation with RVR and discharged on metoprolol 11m BID and Eliquis 583mBID. Symptoms have slightly improved on metoprolol but he continues to have poor exercise tolerance and exercise induced tachycardia. Denies associated chest pain or chest pressure. 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 He denies lower extremity swelling, orthopnea, or PND. Dizziness and lightheadedness have improved off of lisinopril. Denies syncopal/presyncopal episodes.   We discussed the results of the Holter monitor and sleep study at today's visit. Holter monitor revealed a 100% atrial fibrillation burden with episodic rapid ventricular response; average HR was 107bpm. Sleep study revealed moderate obstructive sleep apnea. Echocardiogram in the hospital on 06/20/19 revealed normal LV systolic function with an EF estimated between 55-60% with mildly elevated right sided pressures and bi-atrial dilation.   Past Medical and Surgical History  Past Medical History Past Medical History:  Diagnosis Date  . Arrhythmia  Afib  . Hypertension   Past Surgical History He has a past surgical history that includes Left distal radius ORIF (Left, 06/03/2018).   Medications and Allergies  Current Medications  Current Outpatient Medications  Medication Sig Dispense Refill  . ELIQUIS 5 mg tablet Take 1 tablet (5 mg total) by mouth 2 (two) times daily 180 tablet 1  . FLAXSEED ORAL Take 2,600 mg by mouth once daily   . metoprolol tartrate (LOPRESSOR) 50 MG tablet Take 1 tablet (50 mg total) by mouth 2 (two) times daily 180 tablet 1  . multivitamin (MEN'S MULTI-VITAMIN) tablet Take 1 tablet by mouth once daily  . AMIOdarone (PACERONE) 200 MG tablet Take 1 tablet (200 mg total) by mouth once daily Take 40074mID for 1 week, 200m106mD for 3 weeks, then 200mg52mly 30 tablet 3  . AMIOdarone (PACERONE) 400 MG tablet Take 1 tablet (400 mg total) by mouth 2 (two) times daily 14 tablet 0   No current facility-administered medications for this visit.   Allergies: Patient has no known allergies.  Social and Family History  Social History reports that he has never smoked. He has never used smokeless tobacco. He reports that he does not drink alcohol or use drugs.  Family History Family History  Problem Relation Age of Onset  . Diabetes type II Mother  . High blood pressure (Hypertension) Mother   Review of Systems   Review of Systems: The patient denies chest pain, shortness of breath, orthopnea, paroxysmal nocturnal dyspnea, pedal edema, palpitations, heart racing, fatigue, dizziness, lightheadedness, presyncope, syncope, leg pain, leg cramping. Review of 12 Systems is negative except as described in HPI.   Physical Examination   Vitals:BP 140/84  Pulse 72  Resp 16  Ht 182.9 cm (6')  Wt (!) 147.9 kg (326 lb)  SpO2 99%  BMI 44.21 kg/m  Ht:182.9 cm (6') Wt:(!) 147.9 kg (326 lb) BSA:BJXB:JYNWace area is 2.74 meters squared. Body mass index is 44.21 kg/m.  General: Well developed, morbidly obese. In no acute distress HEENT: Pupils equally reactive to light and accomodation  Neck: Supple without thyromegaly, or goiter. Carotid  pulses 2+. No carotid bruits present.  Pulmonary: Clear to auscultation bilaterally; no wheezes, rales, rhonchi Cardiovascular: Irregularly irregular, rate controlled. No gallops, murmurs or rubs Gastrointestinal: Soft nontender, nondistended, with normal bowel  sounds Extremities: No cyanosis, clubbing, or edema Peripheral Pulses: 2+ in upper extremities, 2+ in lower extremities  Neurology: Alert and oriented X3 Pysch: Good affect. Responds appropriately  Assessment and Plan   48 y.o. male with  1. HTN, goal below 140/90  -Continue metoprolol 78m BID  2. Atrial fibrillation, unspecified type (CMS-HCC)  -Remains symptomatic with fair rate control on metoprolol 524mBID  -Plan cardioversion.  -Stressed the importance of remaining on Eliquis 9m41mID prior to anticipated cardioversion; with CHA2D2s VASc score of 1, for hypertension, can consider d/c Eliquis following cardioversion  -Encouraged to begin use of CPAP  -Weight loss encouraged  3. Morbid obesity with BMI of 40.0-44.9, adult (CMS-HCC)  -Weight loss encouraged through dietary modifications and physical activity as tolerated  4. Obstructive sleep apnea  -Moderate per home sleep study; will send in prescription for CPAP     No orders of the defined types were placed in this encounter.  Bradley Sexton  Pt seen and examined. No change from above.

## 2019-09-06 NOTE — Anesthesia Post-op Follow-up Note (Signed)
Anesthesia QCDR form completed.        

## 2019-10-03 IMAGING — DX DG WRIST COMPLETE 3+V*L*
4 series · 4 of 4 positions shown · non-contrast
Comparison: None.

CLINICAL DATA: Fell today with pain and deformity.

EXAM:
LEFT WRIST - COMPLETE 3+ VIEW

[wrist ap (1 of 2)]
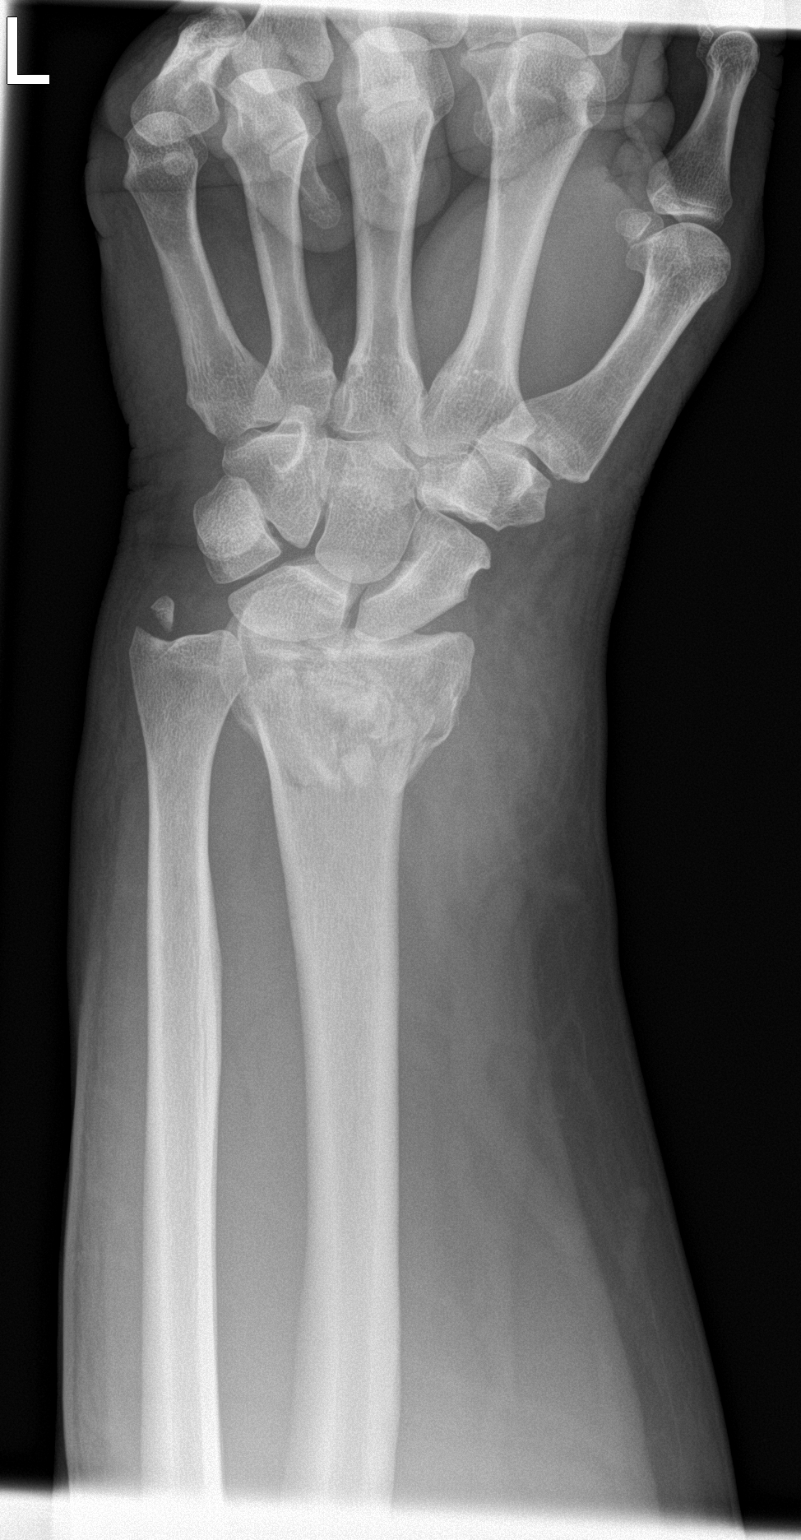

[wrist obl]
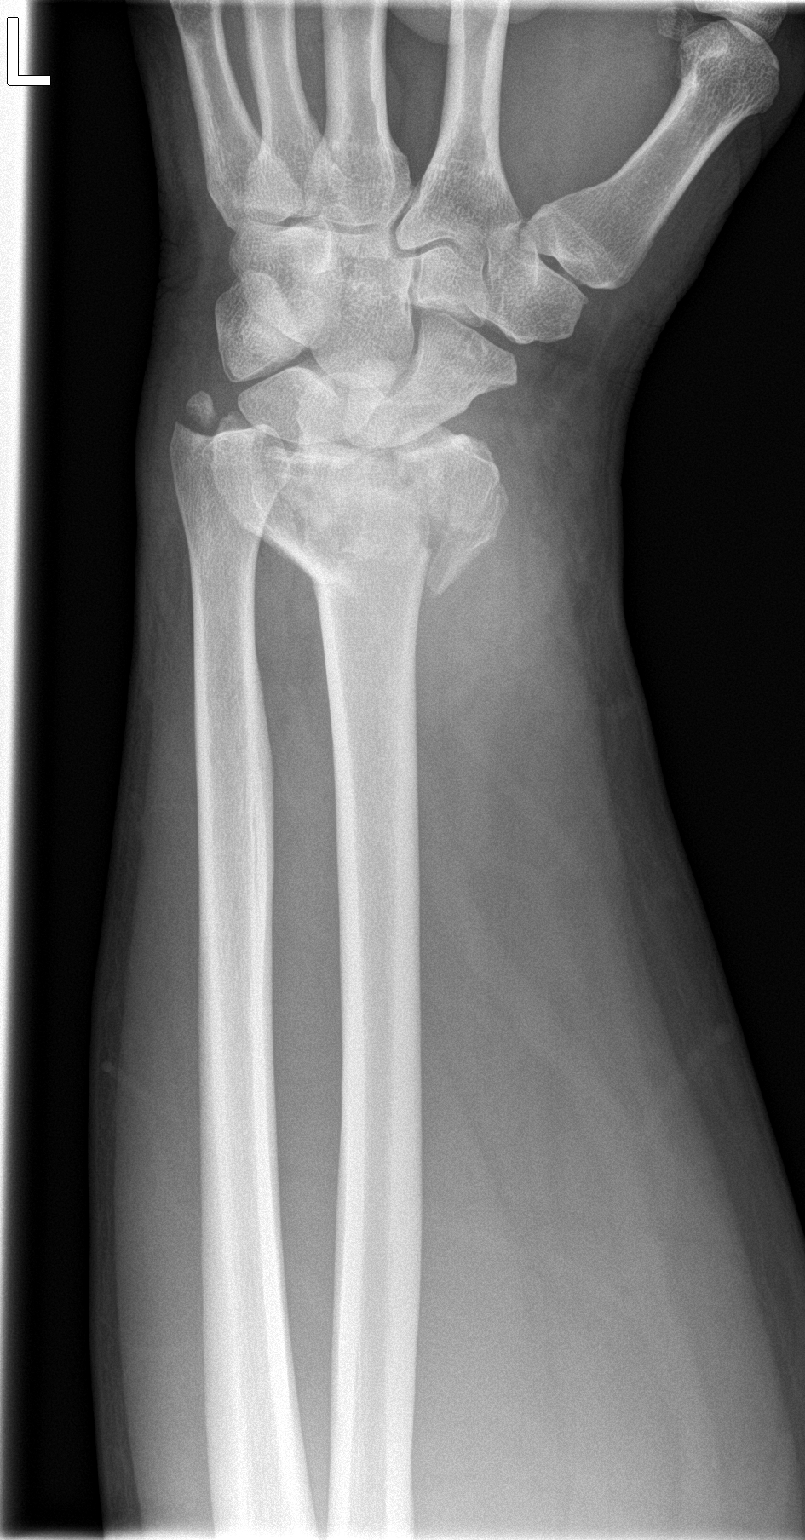

[wrist lat]
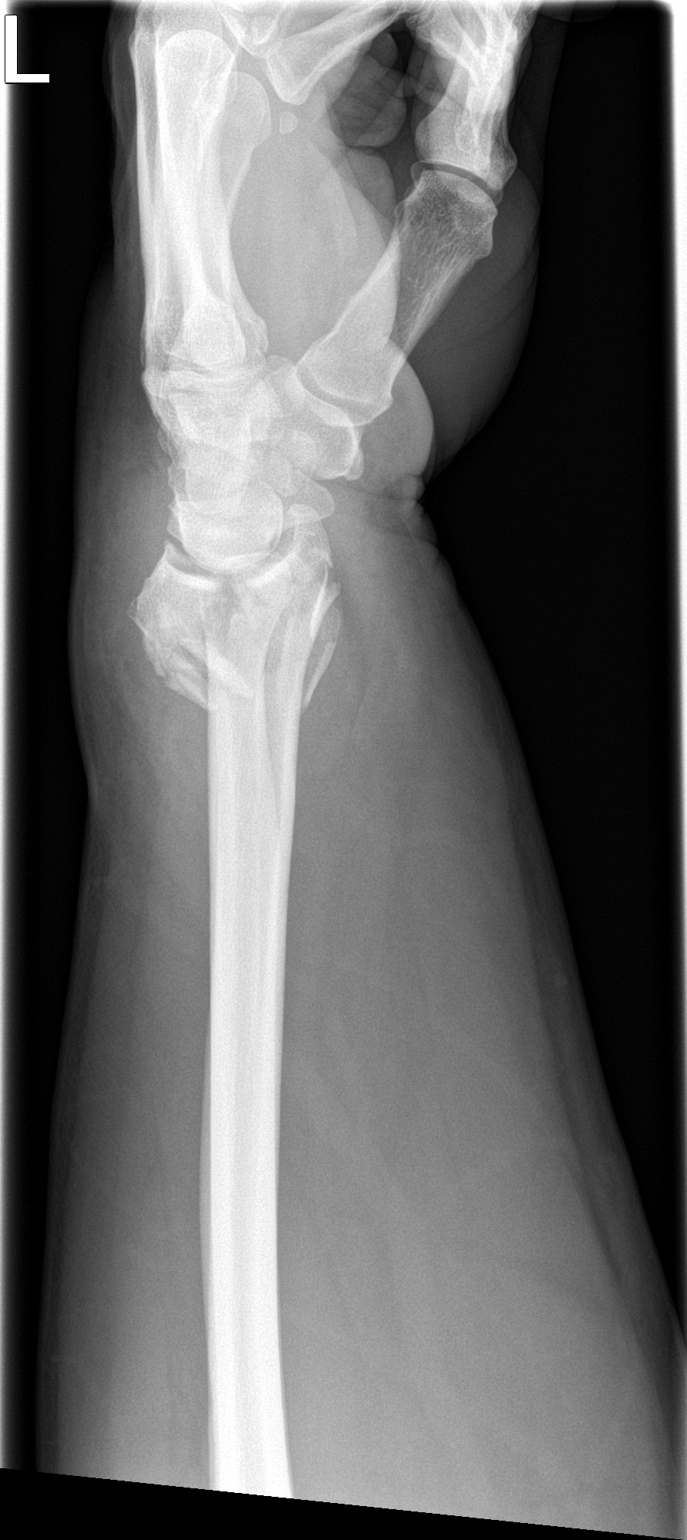

[wrist ap (2 of 2)]
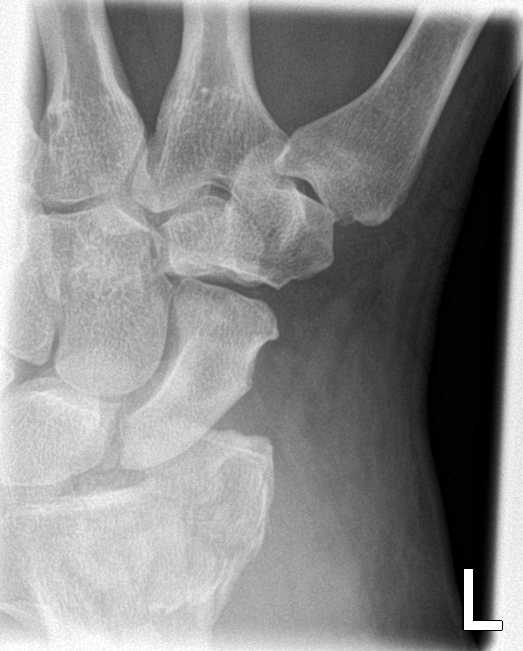

[4 of 4 positions shown; findings below may reference images not displayed]

FINDINGS: Transverse fracture of the ulnar styloid. Impacted comminuted
fracture of the distal radius consistent with advanced Colles
fracture. Carpal bones appear intact.
IMPRESSION: Fracture of the ulnar styloid. Impacted multiply comminuted fracture
of the distal radius.

## 2020-03-21 ENCOUNTER — Other Ambulatory Visit: Payer: Self-pay

## 2020-03-21 DIAGNOSIS — Z0283 Encounter for blood-alcohol and blood-drug test: Secondary | ICD-10-CM

## 2020-03-21 NOTE — Progress Notes (Signed)
[facility] specimen ID: [id number]

## 2022-04-15 MED ORDER — SODIUM CHLORIDE 0.9 % IV SOLN: INTRAVENOUS | Status: DC

## 2022-04-16 ENCOUNTER — Encounter
Admission: RE | Disposition: A | Discharge status: Home / Self Care | Payer: Self-pay | Source: Ambulatory Visit | Attending: Internal Medicine

## 2022-04-16 ENCOUNTER — Ambulatory Visit
Admission: RE | Admit: 2022-04-16 | Discharge: 2022-04-16 | Disposition: A | Discharge status: Home / Self Care | Payer: Managed Care, Other (non HMO) | Source: Ambulatory Visit | Attending: Internal Medicine | Admitting: Internal Medicine

## 2022-04-16 ENCOUNTER — Ambulatory Visit: Payer: Managed Care, Other (non HMO) | Admitting: Certified Registered"

## 2022-04-16 ENCOUNTER — Encounter: Payer: Self-pay | Admitting: Internal Medicine

## 2022-04-16 ENCOUNTER — Other Ambulatory Visit: Payer: Self-pay

## 2022-04-16 DIAGNOSIS — I4891 Unspecified atrial fibrillation: Secondary | ICD-10-CM

## 2022-04-16 DIAGNOSIS — I10 Essential (primary) hypertension: Secondary | ICD-10-CM | POA: Diagnosis not present

## 2022-04-16 DIAGNOSIS — Z7901 Long term (current) use of anticoagulants: Secondary | ICD-10-CM | POA: Diagnosis not present

## 2022-04-16 DIAGNOSIS — G4730 Sleep apnea, unspecified: Secondary | ICD-10-CM | POA: Diagnosis not present

## 2022-04-16 DIAGNOSIS — I480 Paroxysmal atrial fibrillation: Secondary | ICD-10-CM | POA: Insufficient documentation

## 2022-04-16 HISTORY — PX: CARDIOVERSION: SHX1299

## 2022-04-16 SURGERY — CARDIOVERSION
Anesthesia: General

## 2022-04-16 MED ORDER — PROPOFOL 10 MG/ML IV BOLUS
INTRAVENOUS | Status: DC | PRN
  Administered 2022-04-16: 70 mg via INTRAVENOUS
  Administered 2022-04-16: 30 mg via INTRAVENOUS

## 2022-04-16 NOTE — Transfer of Care (Signed)
Immediate Anesthesia Transfer of Care Note  Patient: Bradley Sexton  Procedure(s) Performed: CARDIOVERSION  Patient Location: PACU and Cath Lab  Anesthesia Type:General  Level of Consciousness: drowsy  Airway & Oxygen Therapy: Patient Spontanous Breathing and Patient connected to nasal cannula oxygen  Post-op Assessment: Report given to RN and Post -op Vital signs reviewed and stable  Post vital signs: Reviewed and stable  Last Vitals:  Vitals Value Taken Time  BP 118/73 04/16/22 0802  Temp    Pulse 58 04/16/22 0805  Resp 19 04/16/22 0805  SpO2 86 % 04/16/22 0805  Vitals shown include unvalidated device data.  Last Pain:  Vitals:   04/16/22 0730  TempSrc: Oral         Complications: No notable events documented.

## 2022-04-16 NOTE — CV Procedure (Signed)
Electrical Cardioversion Procedure Note Bradley Sexton [id number] 1970-11-13  Procedure: Electrical Cardioversion Indications:  Paroxysmal non valvular atrial fibrillation  Procedure Details Consent: Risks of procedure as well as the alternatives and risks of each were explained to the (patient/caregiver).  Consent for procedure obtained. Time Out: Verified patient identification, verified procedure, site/side was marked, verified correct patient position, special equipment/implants available, medications/allergies/relevent history reviewed, required imaging and test results available.  Performed  Patient placed on cardiac monitor, pulse oximetry, supplemental oxygen as necessary.  Sedation given: Propofol and versed as per anesthesia  Pacer pads placed anterior and posterior chest.  Cardioverted 3 time(s).  Cardioverted at 200J.  Evaluation Findings: Post procedure EKG shows: NSR Complications: None Patient did  tolerate procedure well.   Arnoldo Hooker M.D. [facility] 04/16/2022, 8:05 AM

## 2022-04-16 NOTE — Anesthesia Procedure Notes (Signed)
Procedure Name: MAC Date/Time: 04/16/2022 8:00 AM  Performed by: Andria Frames, MD

## 2022-04-16 NOTE — Anesthesia Postprocedure Evaluation (Signed)
Anesthesia Post Note  Patient: Bradley Sexton  Procedure(s) Performed: CARDIOVERSION  Patient location during evaluation: Specials Recovery Anesthesia Type: General Level of consciousness: awake and alert Pain management: pain level controlled Vital Signs Assessment: post-procedure vital signs reviewed and stable Respiratory status: spontaneous breathing, nonlabored ventilation, respiratory function stable and patient connected to nasal cannula oxygen Cardiovascular status: blood pressure returned to baseline and stable Postop Assessment: no apparent nausea or vomiting Anesthetic complications: no   No notable events documented.   Last Vitals:  Vitals:   04/16/22 0815 04/16/22 0830  BP: 115/82 (!) 141/99  Pulse: (!) 52 (!) 53  Resp: 11 15  Temp:    SpO2: 96% 98%    Last Pain:  Vitals:   04/16/22 0730  TempSrc: Oral                 [name]

## 2022-04-16 NOTE — Anesthesia Preprocedure Evaluation (Signed)
Anesthesia Evaluation  Patient identified by MRN, date of birth, ID band Patient awake    Reviewed: Allergy & Precautions, NPO status , Patient's Chart, lab work & pertinent test results  History of Anesthesia Complications Negative for: history of anesthetic complications  Airway Mallampati: III  TM Distance: <3 FB Neck ROM: full    Dental  (+) Chipped, Caps   Pulmonary neg shortness of breath, sleep apnea and Continuous Positive Airway Pressure Ventilation ,    Pulmonary exam normal        Cardiovascular Exercise Tolerance: Good hypertension, (-) angina+ dysrhythmias Atrial Fibrillation  Rhythm:irregular     Neuro/Psych negative neurological ROS  negative psych ROS   GI/Hepatic negative GI ROS, Neg liver ROS, neg GERD  ,  Endo/Other  negative endocrine ROS  Renal/GU negative Renal ROS  negative genitourinary   Musculoskeletal   Abdominal   Peds  Hematology negative hematology ROS (+)   Anesthesia Other Findings Past Medical History: 2016: Atrial fibrillation (HCC)     Comment:  X 1 WHEN GASSED AT [employer] No date: Hypertension  Past Surgical History: 09/06/2019: CARDIOVERSION; N/A     Comment:  Procedure: CARDIOVERSION;  Surgeon: Dalia Heading, MD;              Location: [facility] ORS;  Service: Cardiovascular;                Laterality: N/A; No date: NO PAST SURGERIES 06/03/2018: OPEN REDUCTION INTERNAL FIXATION (ORIF) DISTAL RADIAL  FRACTURE; Left     Comment:  Procedure: OPEN REDUCTION INTERNAL FIXATION (ORIF)               DISTAL RADIAL FRACTURE;  Surgeon: Kennedy Bucker, MD;                Location: [facility] ORS;  Service: Orthopedics;  Laterality:               Left;     Reproductive/Obstetrics negative OB ROS                             Anesthesia Physical Anesthesia Plan  ASA: 3  Anesthesia Plan: General   Post-op Pain Management:    Induction:  Intravenous  PONV Risk Score and Plan: Propofol infusion and TIVA  Airway Management Planned: Natural Airway and Nasal Cannula  Additional Equipment:   Intra-op Plan:   Post-operative Plan:   Informed Consent: I have reviewed the patients History and Physical, chart, labs and discussed the procedure including the risks, benefits and alternatives for the proposed anesthesia with the patient or authorized representative who has indicated his/her understanding and acceptance.     Dental Advisory Given  Plan Discussed with: Anesthesiologist, CRNA and Surgeon  Anesthesia Plan Comments: (Patient consented for risks of anesthesia including but not limited to:  - adverse reactions to medications - risk of airway placement if required - damage to eyes, teeth, lips or other oral mucosa - nerve damage due to positioning  - sore throat or hoarseness - Damage to heart, brain, nerves, lungs, other parts of body or loss of life  Patient voiced understanding.)        Anesthesia Quick Evaluation

## 2022-04-16 NOTE — Progress Notes (Signed)
Dr. Gwen Pounds spoke with pt. At bedside post cardioversion. Pt. Verb. Understanding.

## 2022-04-18 ENCOUNTER — Encounter: Payer: Self-pay | Admitting: Internal Medicine
